# Patient Record
Sex: Female | Born: 1955
Health system: Southern US, Community
[De-identification: ages and names within clinical notes are randomized; demographics above are authoritative.]

## PROBLEM LIST (undated history)

## (undated) DIAGNOSIS — E785 Hyperlipidemia, unspecified: Secondary | ICD-10-CM

## (undated) DIAGNOSIS — K219 Gastro-esophageal reflux disease without esophagitis: Secondary | ICD-10-CM

## (undated) HISTORY — DX: Hyperlipidemia, unspecified: E78.5

## (undated) HISTORY — DX: Gastro-esophageal reflux disease without esophagitis: K21.9

## (undated) HISTORY — PX: PARTIAL HYSTERECTOMY: SHX80

---

## 2006-09-25 ENCOUNTER — Ambulatory Visit: Payer: Self-pay | Admitting: Family Medicine

## 2006-09-25 LAB — CONVERTED CEMR LAB
ALT: 24 units/L (ref 0–40)
Basophils Relative: 0.6 % (ref 0.0–1.0)
Calcium: 9.7 mg/dL (ref 8.4–10.5)
Chloride: 106 meq/L (ref 96–112)
Chol/HDL Ratio, serum: 3.3
Cholesterol: 269 mg/dL (ref 0–200)
Eosinophil percent: 1.8 % (ref 0.0–5.0)
Glucose, Bld: 82 mg/dL (ref 70–99)
HCT: 41 % (ref 36.0–46.0)
Lymphocytes Relative: 25.5 % (ref 12.0–46.0)
MCV: 89.2 fL (ref 78.0–100.0)
Monocytes Absolute: 0.3 10*3/uL (ref 0.2–0.7)
Neutrophils Relative %: 67.6 % (ref 43.0–77.0)
Platelets: 175 10*3/uL (ref 150–400)
TSH: 1.54 microintl units/mL (ref 0.35–5.50)
WBC: 6.8 10*3/uL (ref 4.5–10.5)

## 2006-10-02 ENCOUNTER — Ambulatory Visit: Payer: Self-pay | Admitting: Family Medicine

## 2014-10-06 ENCOUNTER — Encounter (INDEPENDENT_AMBULATORY_CARE_PROVIDER_SITE_OTHER): Payer: Self-pay | Admitting: *Deleted

## 2014-11-17 ENCOUNTER — Ambulatory Visit (INDEPENDENT_AMBULATORY_CARE_PROVIDER_SITE_OTHER): Payer: Self-pay | Admitting: Internal Medicine

## 2016-02-27 DIAGNOSIS — K589 Irritable bowel syndrome without diarrhea: Secondary | ICD-10-CM | POA: Insufficient documentation

## 2016-02-27 DIAGNOSIS — E7800 Pure hypercholesterolemia, unspecified: Secondary | ICD-10-CM | POA: Insufficient documentation

## 2017-01-15 DIAGNOSIS — Z1231 Encounter for screening mammogram for malignant neoplasm of breast: Secondary | ICD-10-CM | POA: Diagnosis not present

## 2017-01-15 DIAGNOSIS — Z1272 Encounter for screening for malignant neoplasm of vagina: Secondary | ICD-10-CM | POA: Diagnosis not present

## 2017-01-15 DIAGNOSIS — Z6825 Body mass index (BMI) 25.0-25.9, adult: Secondary | ICD-10-CM | POA: Diagnosis not present

## 2017-01-15 DIAGNOSIS — Z90710 Acquired absence of both cervix and uterus: Secondary | ICD-10-CM | POA: Diagnosis not present

## 2017-01-15 DIAGNOSIS — Z01419 Encounter for gynecological examination (general) (routine) without abnormal findings: Secondary | ICD-10-CM | POA: Diagnosis not present

## 2017-01-17 DIAGNOSIS — K219 Gastro-esophageal reflux disease without esophagitis: Secondary | ICD-10-CM | POA: Diagnosis not present

## 2017-01-17 DIAGNOSIS — J029 Acute pharyngitis, unspecified: Secondary | ICD-10-CM | POA: Diagnosis not present

## 2017-02-06 DIAGNOSIS — R070 Pain in throat: Secondary | ICD-10-CM | POA: Diagnosis not present

## 2017-02-06 DIAGNOSIS — Z0000 Encounter for general adult medical examination without abnormal findings: Secondary | ICD-10-CM | POA: Diagnosis not present

## 2017-02-07 ENCOUNTER — Encounter (INDEPENDENT_AMBULATORY_CARE_PROVIDER_SITE_OTHER): Payer: Self-pay | Admitting: Internal Medicine

## 2017-02-07 ENCOUNTER — Encounter (INDEPENDENT_AMBULATORY_CARE_PROVIDER_SITE_OTHER): Payer: Self-pay

## 2017-02-25 ENCOUNTER — Ambulatory Visit (INDEPENDENT_AMBULATORY_CARE_PROVIDER_SITE_OTHER): Payer: Federal, State, Local not specified - PPO | Admitting: Internal Medicine

## 2017-02-25 ENCOUNTER — Encounter (INDEPENDENT_AMBULATORY_CARE_PROVIDER_SITE_OTHER): Payer: Self-pay | Admitting: *Deleted

## 2017-02-25 ENCOUNTER — Encounter (INDEPENDENT_AMBULATORY_CARE_PROVIDER_SITE_OTHER): Payer: Self-pay | Admitting: Internal Medicine

## 2017-02-25 VITALS — BP 116/70 | HR 64 | Temp 98.4°F | Ht <= 58 in | Wt 119.1 lb

## 2017-02-25 DIAGNOSIS — K219 Gastro-esophageal reflux disease without esophagitis: Secondary | ICD-10-CM

## 2017-02-25 DIAGNOSIS — R1319 Other dysphagia: Secondary | ICD-10-CM

## 2017-02-25 DIAGNOSIS — R1310 Dysphagia, unspecified: Secondary | ICD-10-CM | POA: Diagnosis not present

## 2017-02-25 HISTORY — DX: Gastro-esophageal reflux disease without esophagitis: K21.9

## 2017-02-25 NOTE — Progress Notes (Signed)
   Subjective:    Patient ID: Sarah Hickman, female    DOB: 1956/10/12, 61 y.o.   MRN: [id number]  HPI  Referred by Bing Matter PA for dysphagia. She says she has been having trouble with acid reflux. When her acid reflux  is bad she will medicine. She tried Prilosec which did not help.  She is taking Zantac at night. Zantac is helping.  She tried Dexilant which gave her diarrhea. She has her HOB propped up.  She says it feels like something is stuck in her throat. She say all her problems is burning in her upper esophagus.  She says with the Prilosec, did  Not help with the burning. She says there is a hurting in her esophagus.  She says foods are lodging in her esophagus.  Nuts and seeds, coconut and breads with seeds feel like they are lodging and is uncomfortable. .  She can eat meats without any problem. No problems with pills She has symptoms for about 6 months.    Review of Systems     Past Medical History:  Diagnosis Date  . GERD (gastroesophageal reflux disease) 02/25/2017    No past surgical history on file.  No Known Allergies  No current outpatient prescriptions on file prior to visit.   No current facility-administered medications on file prior to visit.    Current Outpatient Prescriptions  Medication Sig Dispense Refill  . Magnesium Citrate 100 MG TABS Take by mouth.    . ranitidine (ZANTAC) 150 MG capsule Take 150 mg by mouth 2 (two) times daily.     No current facility-administered medications for this visit.      Objective:   Physical Exam Blood pressure 116/70, pulse 64, temperature 98.4 F (36.9 C), height 4\' 10"  (1.473 m), weight 119 lb 1.6 oz (54 kg). Alert and oriented. Skin warm and dry. Oral mucosa is moist.   . Sclera anicteric, conjunctivae is pink. Thyroid not enlarged. No cervical lymphadenopathy. Lungs clear. Heart regular rate and rhythm.  Abdomen is soft. Bowel sounds are positive. No hepatomegaly. No abdominal masses felt. No tenderness.  No edema  to lower extremities.          Assessment & Plan:  Dysphagia. DG esophagram. Further recommendations to follow.

## 2017-02-25 NOTE — Patient Instructions (Signed)
DG Esphagram.  

## 2017-02-27 ENCOUNTER — Encounter (HOSPITAL_COMMUNITY): Payer: Self-pay | Admitting: Radiology

## 2017-02-27 ENCOUNTER — Ambulatory Visit (HOSPITAL_COMMUNITY)
Admission: RE | Admit: 2017-02-27 | Discharge: 2017-02-27 | Disposition: A | Discharge status: Home / Self Care | Payer: Federal, State, Local not specified - PPO | Source: Ambulatory Visit | Attending: Internal Medicine | Admitting: Internal Medicine

## 2017-02-27 DIAGNOSIS — R1319 Other dysphagia: Secondary | ICD-10-CM

## 2017-02-27 DIAGNOSIS — K224 Dyskinesia of esophagus: Secondary | ICD-10-CM | POA: Diagnosis not present

## 2017-02-27 DIAGNOSIS — R1310 Dysphagia, unspecified: Secondary | ICD-10-CM | POA: Diagnosis present

## 2017-03-07 ENCOUNTER — Encounter (INDEPENDENT_AMBULATORY_CARE_PROVIDER_SITE_OTHER): Payer: Self-pay

## 2017-07-18 ENCOUNTER — Ambulatory Visit (INDEPENDENT_AMBULATORY_CARE_PROVIDER_SITE_OTHER): Payer: Federal, State, Local not specified - PPO | Admitting: Otolaryngology

## 2017-07-18 DIAGNOSIS — R070 Pain in throat: Secondary | ICD-10-CM

## 2017-07-18 DIAGNOSIS — K219 Gastro-esophageal reflux disease without esophagitis: Secondary | ICD-10-CM | POA: Diagnosis not present

## 2017-10-23 DIAGNOSIS — N644 Mastodynia: Secondary | ICD-10-CM | POA: Diagnosis not present

## 2017-10-30 DIAGNOSIS — N644 Mastodynia: Secondary | ICD-10-CM | POA: Diagnosis not present

## 2017-11-29 DIAGNOSIS — N644 Mastodynia: Secondary | ICD-10-CM | POA: Diagnosis not present

## 2017-11-29 DIAGNOSIS — R202 Paresthesia of skin: Secondary | ICD-10-CM | POA: Diagnosis not present

## 2018-05-21 DIAGNOSIS — Z0389 Encounter for observation for other suspected diseases and conditions ruled out: Secondary | ICD-10-CM | POA: Diagnosis not present

## 2018-05-21 DIAGNOSIS — N951 Menopausal and female climacteric states: Secondary | ICD-10-CM | POA: Diagnosis not present

## 2018-05-21 DIAGNOSIS — E785 Hyperlipidemia, unspecified: Secondary | ICD-10-CM | POA: Diagnosis not present

## 2018-05-21 DIAGNOSIS — R5383 Other fatigue: Secondary | ICD-10-CM | POA: Diagnosis not present

## 2018-05-21 DIAGNOSIS — R7302 Impaired glucose tolerance (oral): Secondary | ICD-10-CM | POA: Diagnosis not present

## 2018-05-21 DIAGNOSIS — Z0000 Encounter for general adult medical examination without abnormal findings: Secondary | ICD-10-CM | POA: Diagnosis not present

## 2018-05-21 DIAGNOSIS — R635 Abnormal weight gain: Secondary | ICD-10-CM | POA: Diagnosis not present

## 2018-07-08 DIAGNOSIS — R635 Abnormal weight gain: Secondary | ICD-10-CM | POA: Diagnosis not present

## 2018-07-08 DIAGNOSIS — E785 Hyperlipidemia, unspecified: Secondary | ICD-10-CM | POA: Diagnosis not present

## 2018-07-08 DIAGNOSIS — N951 Menopausal and female climacteric states: Secondary | ICD-10-CM | POA: Diagnosis not present

## 2018-07-08 DIAGNOSIS — R5383 Other fatigue: Secondary | ICD-10-CM | POA: Diagnosis not present

## 2018-08-14 DIAGNOSIS — R7302 Impaired glucose tolerance (oral): Secondary | ICD-10-CM | POA: Diagnosis not present

## 2018-08-14 DIAGNOSIS — R1013 Epigastric pain: Secondary | ICD-10-CM | POA: Diagnosis not present

## 2018-08-14 DIAGNOSIS — E785 Hyperlipidemia, unspecified: Secondary | ICD-10-CM | POA: Diagnosis not present

## 2018-11-10 DIAGNOSIS — E785 Hyperlipidemia, unspecified: Secondary | ICD-10-CM | POA: Diagnosis not present

## 2018-11-12 DIAGNOSIS — R1013 Epigastric pain: Secondary | ICD-10-CM | POA: Diagnosis not present

## 2018-11-12 DIAGNOSIS — E785 Hyperlipidemia, unspecified: Secondary | ICD-10-CM | POA: Diagnosis not present

## 2018-11-12 DIAGNOSIS — R196 Halitosis: Secondary | ICD-10-CM | POA: Diagnosis not present

## 2019-01-09 ENCOUNTER — Encounter (INDEPENDENT_AMBULATORY_CARE_PROVIDER_SITE_OTHER): Payer: Self-pay | Admitting: Nurse Practitioner

## 2019-09-16 ENCOUNTER — Other Ambulatory Visit (INDEPENDENT_AMBULATORY_CARE_PROVIDER_SITE_OTHER): Payer: Federal, State, Local not specified - PPO

## 2019-09-21 ENCOUNTER — Other Ambulatory Visit (INDEPENDENT_AMBULATORY_CARE_PROVIDER_SITE_OTHER): Payer: Medicare Other

## 2019-09-21 ENCOUNTER — Other Ambulatory Visit: Payer: Self-pay

## 2019-09-21 ENCOUNTER — Other Ambulatory Visit (INDEPENDENT_AMBULATORY_CARE_PROVIDER_SITE_OTHER): Payer: Self-pay | Admitting: Internal Medicine

## 2019-09-21 DIAGNOSIS — K219 Gastro-esophageal reflux disease without esophagitis: Secondary | ICD-10-CM

## 2019-09-21 DIAGNOSIS — E559 Vitamin D deficiency, unspecified: Secondary | ICD-10-CM

## 2019-09-21 DIAGNOSIS — Z0001 Encounter for general adult medical examination with abnormal findings: Secondary | ICD-10-CM

## 2019-09-21 DIAGNOSIS — R5383 Other fatigue: Secondary | ICD-10-CM

## 2019-09-21 DIAGNOSIS — R5381 Other malaise: Secondary | ICD-10-CM

## 2019-09-22 LAB — COMPLETE METABOLIC PANEL WITH GFR
AG Ratio: 1.6 (calc) (ref 1.0–2.5)
ALT: 19 U/L (ref 6–29)
AST: 21 U/L (ref 10–35)
Albumin: 4.7 g/dL (ref 3.6–5.1)
Alkaline phosphatase (APISO): 73 U/L (ref 37–153)
BUN: 13 mg/dL (ref 7–25)
CO2: 25 mmol/L (ref 20–32)
Calcium: 9.9 mg/dL (ref 8.6–10.4)
Chloride: 102 mmol/L (ref 98–110)
Creat: 0.63 mg/dL (ref 0.50–0.99)
GFR, Est African American: 109 mL/min/{1.73_m2} (ref >=60)
GFR, Est Non African American: 94 mL/min/{1.73_m2} (ref >=60)
Globulin: 2.9 g/dL (calc) (ref 1.9–3.7)
Glucose, Bld: 93 mg/dL (ref 65–99)
Potassium: 4.1 mmol/L (ref 3.5–5.3)
Sodium: 140 mmol/L (ref 135–146)
Total Bilirubin: 1 mg/dL (ref 0.2–1.2)
Total Protein: 7.6 g/dL (ref 6.1–8.1)

## 2019-09-22 LAB — CBC
HCT: 43.5 % (ref 35.0–45.0)
Hemoglobin: 14.5 g/dL (ref 11.7–15.5)
MCH: 30 pg (ref 27.0–33.0)
MCHC: 33.3 g/dL (ref 32.0–36.0)
MCV: 89.9 fL (ref 80.0–100.0)
MPV: 13.4 fL — ABNORMAL HIGH (ref 7.5–12.5)
Platelets: 175 10*3/uL (ref 140–400)
RBC: 4.84 10*6/uL (ref 3.80–5.10)
RDW: 12.6 % (ref 11.0–15.0)
WBC: 7.3 10*3/uL (ref 3.8–10.8)

## 2019-09-22 LAB — VITAMIN D 25 HYDROXY (VIT D DEFICIENCY, FRACTURES): Vit D, 25-Hydroxy: 39 ng/mL (ref 30–100)

## 2019-09-22 LAB — TSH: TSH: 2.22 mIU/L (ref 0.40–4.50)

## 2019-09-22 LAB — T4: T4, Total: 9.8 ug/dL (ref 5.1–11.9)

## 2019-09-22 LAB — T3, FREE: T3, Free: 3.2 pg/mL (ref 2.3–4.2)

## 2019-09-29 ENCOUNTER — Encounter (INDEPENDENT_AMBULATORY_CARE_PROVIDER_SITE_OTHER): Payer: Federal, State, Local not specified - PPO | Admitting: Nurse Practitioner

## 2019-10-15 ENCOUNTER — Ambulatory Visit (INDEPENDENT_AMBULATORY_CARE_PROVIDER_SITE_OTHER): Payer: Medicare Other | Admitting: Nurse Practitioner

## 2019-10-15 ENCOUNTER — Other Ambulatory Visit: Payer: Self-pay

## 2019-10-15 ENCOUNTER — Encounter (INDEPENDENT_AMBULATORY_CARE_PROVIDER_SITE_OTHER): Payer: Self-pay | Admitting: Nurse Practitioner

## 2019-10-15 VITALS — BP 122/72 | HR 77 | Temp 97.6°F | Resp 12 | Ht <= 58 in | Wt 131.8 lb

## 2019-10-15 DIAGNOSIS — Z0001 Encounter for general adult medical examination with abnormal findings: Secondary | ICD-10-CM | POA: Diagnosis not present

## 2019-10-15 DIAGNOSIS — E785 Hyperlipidemia, unspecified: Secondary | ICD-10-CM | POA: Diagnosis not present

## 2019-10-15 DIAGNOSIS — K219 Gastro-esophageal reflux disease without esophagitis: Secondary | ICD-10-CM | POA: Diagnosis not present

## 2019-10-15 DIAGNOSIS — Z0000 Encounter for general adult medical examination without abnormal findings: Secondary | ICD-10-CM | POA: Insufficient documentation

## 2019-10-15 DIAGNOSIS — L539 Erythematous condition, unspecified: Secondary | ICD-10-CM | POA: Diagnosis not present

## 2019-10-15 DIAGNOSIS — Z1159 Encounter for screening for other viral diseases: Secondary | ICD-10-CM | POA: Diagnosis not present

## 2019-10-15 NOTE — Assessment & Plan Note (Signed)
I recommended that she discuss this in further detail with her gastroenterologist when she sees them.  She would like to have endoscopy done for further evaluation of her symptoms.  I encouraged her to discuss this with them in addition to discussing colon cancer screening.  She tells me that she will.

## 2019-10-15 NOTE — Patient Instructions (Signed)
Thank you for choosing [facility] as your medical provider! If you have any questions or concerns regarding your health care, please do not hesitate to call our office.  Immunization: He would qualify for flu, pneumonia, and shingles vaccines.  If you would like these vaccinations please call this office.  Health maintenance: Please follow-up with the gastroenterologist as well as your OB/GYN for routine health maintenance.  Specifically for colon cancer screening as well as breast cancer screening and osteoporosis screening.  Remember to discuss your breast symptoms with your OB/GYN that we discussed in your office visit.  Blood work: Once I get your blood work results I will notify you of these and we will determine if changes to your treatment plan need to be made.  Please follow-up as scheduled in 6 months. We look forward to seeing you again soon! Have a great new year!!  At [facility] we value your feedback. You may receive a survey about your visit today. Please share your experience as we strive to create trusting relationships with our patients to provide genuine, compassionate, quality care.  We appreciate your understanding and patience as we review any laboratory studies, imaging, and other diagnostic tests that are ordered as we care for you. We do our best to address any and all results in a timely manner. If you do not hear about test results within 1 week, please do not hesitate to contact us. If we referred you to a specialist during your visit or ordered imaging testing, contact the office if you have not been contacted to be scheduled within 1 weeks.  We also encourage the use of MyChart, which contains your medical information for your review as well. If you are not enrolled in this feature, an access code is on this after visit summary for your convenience. Thank you for allowing [place] to be involved in your care.

## 2019-10-15 NOTE — Assessment & Plan Note (Signed)
I encouraged her to get immunizations especially to the flu and pneumonia vaccine.  She has declined to have these done today.  I also discussed that if she were to have any kind of penetrating wound she would need a tetanus shot.  She tells me she understands.  As for screenings I encouraged her to follow-up with her OB/GYN for Pap smear, breast cancer screening, and osteoporosis screening.  If her OB/GYN cannot do osteoporosis screening I would recommend she let us know and I can order a DEXA scan for her.  She declined to have sexual transmitted infection screening today we will also screen for hepatitis C.  Depression screen was negative.  She seems to be a low fall risk at this time.  She will follow-up for annual physical exam in 1 year.

## 2019-10-15 NOTE — Progress Notes (Addendum)
Subjective:  Patient ID: Sarah Hickman, female    DOB: 1956-09-01  Age: 63 y.o. MRN: [id number]  CC:  Chief Complaint  Patient presents with  . Annual Exam      HPI  Patient presents today for her annual physical exam.  Immunizations: She would be due for flu, pneumonia, shingles vaccines.  She tells me she would like to hold off on all vaccinations at this time.  Health maintenance screenings: She tells me she is scheduled to see her OB/GYN next month at which point she will have a Pap smear, mammogram, and will consider discussing bone density screening with them as she has completed this with them in the past.  She has me she is also scheduled to see her gastroenterologist within the next couple months as well for colon cancer screening as well as to discuss a sensation of throat swelling that has been an ongoing issue for some time.  She does have an extensive history of GERD.  She declined to have sexual transmitted infection screening done today.  She is due for depression screening.  She denies any falls in the last year.  She would be willing to undergo hepatitis C screening today.  She does not qualify for lung cancer screening nor does she need to discuss tobacco cessation.  She also complains today of a redness that comes and goes intermittently around her left nipple.  She also tells me she sometimes feels a sensation of let down, but she is postmenopausal and is not currently lactating.  She denies any nipple discharge or notices any masses to either breast.  She tells me she had similar feelings of letdown in the past when she was drinking a herbal tea, when she stopped drinking the herbal tea, the feeling passed.  She also tells me that she was evaluated by her OB/GYN in the past when she had this sensation.  She tells me recently she started drinking a different herbal tea and when she looked at the ingredients had similar ingredients to the one that she took in the past.   She has since stopped drinking herbal tea.  The sensation of letdown has now passed, but she still intermittently has the redness to the nipple area.  She denies any rash or appearance of dry skin or other skin changes to that area.  She also mentions a sensation of swelling in her throat, that has been bothersome for some time now.  She has extensive history of GERD and used to be on ranitidine but stopped this since it was recalled.  She is not on any other prescription medication for her GERD at this time.  She is planning on discussing this with her gastroenterologist when she sees them later on next month.  Hyperlipidemia: She has a history of hyperlipidemia.  Last lipid panel was collected in January 2020.  Total cholesterol that time was 289, HDL was 99, triglycerides was 98, and LDL was 168.  I believe at that time she was taking omega-3 supplementation.  It does not appear that she is taking this currently.  For some reason a repeat lipid panel was ordered last month when her blood work was collected, but it never resulted.  Past Medical History:  Diagnosis Date  . GERD (gastroesophageal reflux disease) 02/25/2017  . Hyperlipidemia       Family History  Problem Relation Age of Onset  . Rheum arthritis Mother   . Heart attack Mother   .  Diabetes Father   . Hypertension Brother   . Heart attack Paternal Grandfather     Social History   Social History Narrative  . Not on file   Social History   Tobacco Use  . Smoking status: Never Smoker  . Smokeless tobacco: Never Used  Substance Use Topics  . Alcohol use: Yes    Alcohol/week: 2.0 standard drinks    Types: 1 Glasses of wine, 1 Cans of beer per week    Comment: occasional (3-4 nights per week/wine)     Current Meds  Medication Sig  . Ascorbic Acid (VITAMIN C) 1000 MG tablet Take 1,000 mg by mouth daily.  . Betaine HCl 300 MG TABS Take by mouth.  . Cholecalciferol 125 MCG (5000 UT) TABS Take 1 tablet by mouth daily.    Marland Kitchen DIGESTIVE ENZYMES PO Take by mouth.  . Magnesium Citrate 100 MG TABS Take 3 tablets by mouth daily.  . Melatonin 10 MG TABS Take 1 tablet by mouth at bedtime as needed.  . [DISCONTINUED] Magnesium Citrate 100 MG TABS Take by mouth.    ROS:  Review of Systems  Constitutional: Negative.   HENT: Positive for ear pain (intermittent, not bothersome today).   Eyes: Negative.   Respiratory: Negative.   Cardiovascular: Negative.   Gastrointestinal: Positive for diarrhea and heartburn. Negative for abdominal pain and blood in stool.  Genitourinary: Negative.   Musculoskeletal: Negative.   Skin:       (+) Redness to left areola, intermittently  Neurological: Negative.   Endo/Heme/Allergies: Negative.   Psychiatric/Behavioral: Negative.      Objective:   Today's Vitals: BP 122/72   Pulse 77   Temp 97.6 F (36.4 C)   Resp 12   Ht 4\' 10"  (1.473 m)   Wt 131 lb 12.8 oz (59.8 kg)   SpO2 97%   BMI 27.55 kg/m  Vitals with BMI 10/15/2019 02/25/2017  Height 4\' 10"  4\' 10"   Weight 131 lbs 13 oz 119 lbs 2 oz  BMI [id number]  Systolic [id number] [id number]  Diastolic 72 70  Pulse 77 64     Physical Exam Vitals reviewed.  Constitutional:      Appearance: Normal appearance.  HENT:     Head: Normocephalic and atraumatic.     Right Ear: Ear canal and external ear normal. There is impacted cerumen.     Left Ear: Ear canal and external ear normal. There is impacted cerumen.  Eyes:     General:        Right eye: No discharge.        Left eye: No discharge.     Extraocular Movements: Extraocular movements intact.     Conjunctiva/sclera: Conjunctivae normal.     Pupils: Pupils are equal, round, and reactive to light.  Neck:     Vascular: No carotid bruit.  Cardiovascular:     Rate and Rhythm: Normal rate and regular rhythm.     Pulses: Normal pulses.     Heart sounds: Normal heart sounds. No murmur.  Pulmonary:     Effort: Pulmonary effort is normal.     Breath sounds: Normal breath sounds.   Chest:     Breasts: Breasts are symmetrical.        Right: Normal.        Left: Inverted nipple present.  Abdominal:     General: Abdomen is flat. Bowel sounds are normal. There is no distension.     Palpations: Abdomen is soft. There is no mass.  Tenderness: There is no abdominal tenderness.  Musculoskeletal:        General: No tenderness.     Cervical back: Neck supple. No muscular tenderness.     Right lower leg: No edema.     Left lower leg: No edema.  Lymphadenopathy:     Cervical: No cervical adenopathy.     Upper Body:     Right upper body: No supraclavicular adenopathy.     Left upper body: No supraclavicular adenopathy.  Skin:    General: Skin is warm and dry.  Neurological:     General: No focal deficit present.     Mental Status: She is alert and oriented to person, place, and time.     Motor: No weakness.     Gait: Gait normal.  Psychiatric:        Mood and Affect: Mood normal.        Behavior: Behavior normal.        Judgment: Judgment normal.     PHQ 2: Negative     Assessment   1. Medicare annual wellness visit, subsequent   2. Encounter for hepatitis C screening test for low risk patient   3. Hyperlipidemia, unspecified hyperlipidemia type   4. Gastroesophageal reflux disease without esophagitis   5. Redness   6. Annual physical exam       Tests ordered Orders Placed This Encounter  Procedures  . Hep C Antibody  . Lipid Panel     Plan: Please see assessment and plan per problem list below.   No orders of the defined types were placed in this encounter.   Patient to follow-up in 6 months or sooner as needed.  She will also be due for annual physical exam again in 1 year.  In addition to performing her annual physical exam I also performed an office visit to address her history of hyperlipidemia as well as acute concerns that were discussed above.  Ailene Ards, NP

## 2019-10-15 NOTE — Assessment & Plan Note (Signed)
I will collect lipid panel today for further evaluation.  Last blood panel was collected in January 2020 and she did have hyperlipidemia.  She was curious to see what her lipid panel is currently, and for some reason this was ordered when her previous blood work was taken but never resulted.  I will reorder this today for further evaluation.

## 2019-10-15 NOTE — Assessment & Plan Note (Signed)
Breast exam was normal today except she did have some mild inversion to her left nipple.  She tells me that this is not a new finding.  No redness, rash, nipple discharge noted.  No masses noted either.  I recommended that she discuss this with her OB/GYN so that when she goes for breast cancer screening that they can do a diagnostic mammogram as opposed to a screening mammogram.  She tells me she understands, and plans on discussing this with them.

## 2019-10-19 LAB — LIPID PANEL
Cholesterol: 293 mg/dL — ABNORMAL HIGH (ref <200)
HDL: 95 mg/dL (ref >=50)
LDL Cholesterol (Calc): 174 mg/dL (calc) — ABNORMAL HIGH
Non-HDL Cholesterol (Calc): 198 mg/dL (calc) — ABNORMAL HIGH (ref <130)
Total CHOL/HDL Ratio: 3.1 (calc) (ref <5.0)
Triglycerides: 111 mg/dL (ref <150)

## 2019-10-19 LAB — HEPATITIS C ANTIBODY
Hepatitis C Ab: NONREACTIVE
SIGNAL TO CUT-OFF: 0.01 (ref <1.00)

## 2019-10-26 ENCOUNTER — Encounter (INDEPENDENT_AMBULATORY_CARE_PROVIDER_SITE_OTHER): Payer: Self-pay | Admitting: Nurse Practitioner

## 2019-10-26 NOTE — Progress Notes (Signed)
I did call this patient today to discuss her lab work.  We discussed trying medication to control her cholesterol, but she would like to focus on lifestyle changes for now as opposed to starting medication to control her cholesterol.  I encouraged to continue doing this.  She is encouraged to call this office any questions or concerns prior to her next appointment.

## 2019-10-28 ENCOUNTER — Other Ambulatory Visit: Payer: Self-pay

## 2019-10-28 ENCOUNTER — Encounter (INDEPENDENT_AMBULATORY_CARE_PROVIDER_SITE_OTHER): Payer: Self-pay | Admitting: *Deleted

## 2019-10-28 ENCOUNTER — Ambulatory Visit (INDEPENDENT_AMBULATORY_CARE_PROVIDER_SITE_OTHER): Payer: Medicare Other | Admitting: Gastroenterology

## 2019-10-28 ENCOUNTER — Encounter (INDEPENDENT_AMBULATORY_CARE_PROVIDER_SITE_OTHER): Payer: Self-pay | Admitting: Gastroenterology

## 2019-10-28 ENCOUNTER — Telehealth (INDEPENDENT_AMBULATORY_CARE_PROVIDER_SITE_OTHER): Payer: Self-pay | Admitting: *Deleted

## 2019-10-28 VITALS — BP 123/78 | HR 102 | Temp 97.1°F | Ht <= 58 in | Wt 129.8 lb

## 2019-10-28 DIAGNOSIS — K219 Gastro-esophageal reflux disease without esophagitis: Secondary | ICD-10-CM | POA: Insufficient documentation

## 2019-10-28 DIAGNOSIS — J029 Acute pharyngitis, unspecified: Secondary | ICD-10-CM | POA: Diagnosis not present

## 2019-10-28 DIAGNOSIS — Z1211 Encounter for screening for malignant neoplasm of colon: Secondary | ICD-10-CM | POA: Diagnosis not present

## 2019-10-28 NOTE — Patient Instructions (Signed)
Try 2-3 week course of allergy medication medication such as zyrtec or claritin in case post nasal drip contributing to symptoms.    We are scheduling endoscopy and colonoscopy.

## 2019-10-28 NOTE — Telephone Encounter (Signed)
Patient needs suprep TCS/EGD sch'd 2/18

## 2019-10-28 NOTE — Progress Notes (Signed)
Patient profile: Sarah Hickman is a 64 y.o. female seen for evaluation of colon cancer screening and globus/sore throat/gerd. Last seen in clinic 2018  History of Present Illness: Sarah Hickman is seen today for a symptom of having  "throat burning" or feeling that something is stuck in her throat/coating her throat, this has been intermittent over many years.  Does not feel the food stick when she is swallowing or that she has to regurgitate food.  Feels that something is coating her esophagus at times.  Most notable after bread.  Other times eating will make it better.  It is associated with a sore throat.  She reports she has been seen by ENT with laryngoscopic suggesting GERD and tried 2 types of PPIs as well as Zantac without improvement of sensation.  She does not have any cough.  She remains upright after meals.  She denies nausea vomiting.  She has classic GERD burning symptoms only after food such as pizza or beer, this feels different than the symptoms she is getting more frequently of the "coating of her esophagus".  Bowels are regular twice a day, she does use a digestive enzyme which makes stools looser.  She denies any blood in stool or black stool.  Denies NSAID use.  Rare alcohol.  Non-smoker.  Wt Readings from Last 3 Encounters:  10/28/19 129 lb 12.8 oz (58.9 kg)  10/15/19 131 lb 12.8 oz (59.8 kg)  02/25/17 119 lb 1.6 oz (54 kg)     Last Colonoscopy: May 2010 - normal Last Endoscopy: none prior   Barium swallow 02/2017-IMPRESSION: Minimal age-related esophageal dysmotility. Otherwise negative exam.   Past Medical History:  Past Medical History:  Diagnosis Date  . GERD (gastroesophageal reflux disease) 02/25/2017  . Hyperlipidemia     Problem List: Patient Active Problem List   Diagnosis Date Noted  . Colon cancer screening 10/28/2019  . Gastroesophageal reflux disease 10/28/2019  . Sore throat 10/28/2019  . Redness 10/15/2019  . Annual physical exam 10/15/2019    . Hyperlipidemia 10/15/2019  . GERD (gastroesophageal reflux disease) 02/25/2017    Past Surgical History: Past Surgical History:  Procedure Laterality Date  . PARTIAL HYSTERECTOMY     one ovary is left (rt). endometriosis    Allergies: No Known Allergies    Home Medications:  Current Outpatient Medications:  .  Ascorbic Acid (VITAMIN C) 1000 MG tablet, Take 1,000 mg by mouth daily., Disp: , Rfl:  .  Betaine HCl 300 MG TABS, Take 300 mg by mouth as needed. , Disp: , Rfl:  .  Cholecalciferol 125 MCG (5000 UT) TABS, Take 1 tablet by mouth daily., Disp: , Rfl:  .  DIGESTIVE ENZYMES PO, Take by mouth daily. , Disp: , Rfl:  .  Magnesium Citrate 100 MG TABS, Take 3 tablets by mouth daily., Disp: , Rfl:  .  Melatonin 10 MG TABS, Take 1 tablet by mouth at bedtime as needed., Disp: , Rfl:    Family History: family history includes Diabetes in her father; Heart attack in her mother and paternal grandfather; Hypertension in her brother; Rheum arthritis in her mother.    Social History:   reports that she has never smoked. She has never used smokeless tobacco. She reports current alcohol use of about 2.0 standard drinks of alcohol per week. She reports that she does not use drugs.   Review of Systems: Constitutional: Denies weight loss/weight gain  Eyes: No changes in vision. ENT: No oral lesions, sore throat.  GI:  see HPI.  Heme/Lymph: No easy bruising.  CV: No chest pain.  GU: No hematuria.  Integumentary: No rashes.  Neuro: No headaches.  Psych: No depression/anxiety.  Endocrine: No heat/cold intolerance.  Allergic/Immunologic: No urticaria.  Resp: No cough, SOB.  Musculoskeletal: No joint swelling.    Physical Examination: BP 123/78 (BP Location: Right Arm, Patient Position: Sitting, Cuff Size: Large)   Pulse (!) 102   Temp (!) 97.1 F (36.2 C) (Temporal)   Ht 4\' 10"  (1.473 m)   Wt 129 lb 12.8 oz (58.9 kg)   BMI 27.13 kg/m  Gen: NAD, alert and oriented x 4 HEENT:  PEERLA, EOMI, Neck: supple, no JVD Chest: CTA bilaterally, no wheezes, crackles, or other adventitious sounds CV: RRR, no m/g/c/r Abd: soft, NT, ND, +BS in all four quadrants; no HSM, guarding, ridigity, or rebound tenderness Ext: no edema, well perfused with 2+ pulses, Skin: no rash or lesions noted on observed skin Lymph: no noted LAD  Data Reviewed:   November 2020-CMP normal, CBC normal  Assessment/Plan: Ms. Woodhull is a 65 y.o. female    Sarah Hickman was seen today for follow-up.  Diagnoses and all orders for this visit:  Colon cancer screening -     Procedural/ Surgical Case Request: COLONOSCOPY, ESOPHAGOGASTRODUODENOSCOPY (EGD); Standing -     Procedural/ Surgical Case Request: COLONOSCOPY, ESOPHAGOGASTRODUODENOSCOPY (EGD)  Gastroesophageal reflux disease, unspecified whether esophagitis present -     Procedural/ Surgical Case Request: COLONOSCOPY, ESOPHAGOGASTRODUODENOSCOPY (EGD); Standing -     Procedural/ Surgical Case Request: COLONOSCOPY, ESOPHAGOGASTRODUODENOSCOPY (EGD)  Sore throat -     Procedural/ Surgical Case Request: COLONOSCOPY, ESOPHAGOGASTRODUODENOSCOPY (EGD); Standing -     Procedural/ Surgical Case Request: COLONOSCOPY, ESOPHAGOGASTRODUODENOSCOPY (EGD)    1.  Colon cancer screening-last colonoscopy 2010, overdue for routine surveillance.  No lower GI symptoms or family history of colon polyps/colon cancer.  2.  GERD/globus/sore throat-has been diagnosed by ENT with GERD but trials of PPI did not help sore throat and feeling of abnormal "coating" of esophagus/globus.  Has classic GERD symptoms after known trigger foods left often.  She has never had endoscopy and will schedule this at time of colonoscopy.  Also suggested trial of allergy medicine as postnasal drip could potentially be contributing.  Patient denies CP, SOB, and use of blood thinners. I discussed the risks and benefits of procedure including bleeding, perforation, infection, missed lesions,  medication reactions and possible hospitalization or surgery if complications. All questions answered.  Denies prior issues with sedation.     I personally performed the service, non-incident to. (WP)  Laurine Blazer, [facility]

## 2019-10-30 MED ORDER — SUPREP BOWEL PREP KIT 17.5-3.13-1.6 GM/177ML PO SOLN: 1.0000 | Freq: Once | ORAL | 0 refills | Status: AC

## 2019-10-30 NOTE — Telephone Encounter (Signed)
Refill sent to pharmacy.   

## 2019-11-02 ENCOUNTER — Ambulatory Visit (INDEPENDENT_AMBULATORY_CARE_PROVIDER_SITE_OTHER): Payer: Medicare Other | Admitting: Gastroenterology

## 2019-11-06 ENCOUNTER — Other Ambulatory Visit (INDEPENDENT_AMBULATORY_CARE_PROVIDER_SITE_OTHER): Payer: Self-pay | Admitting: *Deleted

## 2019-11-10 ENCOUNTER — Other Ambulatory Visit (INDEPENDENT_AMBULATORY_CARE_PROVIDER_SITE_OTHER): Payer: Self-pay | Admitting: *Deleted

## 2019-11-11 ENCOUNTER — Other Ambulatory Visit (INDEPENDENT_AMBULATORY_CARE_PROVIDER_SITE_OTHER): Payer: Self-pay | Admitting: *Deleted

## 2019-11-18 ENCOUNTER — Other Ambulatory Visit (INDEPENDENT_AMBULATORY_CARE_PROVIDER_SITE_OTHER): Payer: Self-pay | Admitting: *Deleted

## 2019-12-08 ENCOUNTER — Other Ambulatory Visit: Payer: Self-pay

## 2019-12-08 ENCOUNTER — Other Ambulatory Visit (HOSPITAL_COMMUNITY)
Admission: RE | Admit: 2019-12-08 | Discharge: 2019-12-08 | Disposition: A | Discharge status: Home / Self Care | Payer: Medicare Other | Source: Ambulatory Visit | Attending: Internal Medicine | Admitting: Internal Medicine

## 2019-12-08 ENCOUNTER — Other Ambulatory Visit (HOSPITAL_COMMUNITY): Payer: Medicare Other

## 2019-12-08 DIAGNOSIS — Z20822 Contact with and (suspected) exposure to covid-19: Secondary | ICD-10-CM | POA: Insufficient documentation

## 2019-12-08 DIAGNOSIS — Z01812 Encounter for preprocedural laboratory examination: Secondary | ICD-10-CM | POA: Diagnosis present

## 2019-12-08 LAB — SARS CORONAVIRUS 2 (TAT 6-24 HRS): SARS Coronavirus 2: NEGATIVE

## 2019-12-09 ENCOUNTER — Other Ambulatory Visit (INDEPENDENT_AMBULATORY_CARE_PROVIDER_SITE_OTHER): Payer: Self-pay | Admitting: *Deleted

## 2020-01-01 ENCOUNTER — Other Ambulatory Visit: Payer: Self-pay

## 2020-01-01 ENCOUNTER — Other Ambulatory Visit (HOSPITAL_COMMUNITY)
Admission: RE | Admit: 2020-01-01 | Discharge: 2020-01-01 | Disposition: A | Discharge status: Home / Self Care | Payer: Medicare Other | Source: Ambulatory Visit | Attending: Internal Medicine | Admitting: Internal Medicine

## 2020-01-01 DIAGNOSIS — Z01812 Encounter for preprocedural laboratory examination: Secondary | ICD-10-CM | POA: Insufficient documentation

## 2020-01-01 DIAGNOSIS — Z20822 Contact with and (suspected) exposure to covid-19: Secondary | ICD-10-CM | POA: Diagnosis not present

## 2020-01-02 LAB — SARS CORONAVIRUS 2 (TAT 6-24 HRS): SARS Coronavirus 2: NEGATIVE

## 2020-01-04 ENCOUNTER — Encounter (HOSPITAL_COMMUNITY)
Admission: RE | Disposition: A | Discharge status: Home / Self Care | Payer: Self-pay | Source: Home / Self Care | Attending: Internal Medicine

## 2020-01-04 ENCOUNTER — Ambulatory Visit (HOSPITAL_COMMUNITY)
Admission: RE | Admit: 2020-01-04 | Discharge: 2020-01-04 | Disposition: A | Discharge status: Home / Self Care | Payer: Medicare Other | Attending: Internal Medicine | Admitting: Internal Medicine

## 2020-01-04 ENCOUNTER — Other Ambulatory Visit: Payer: Self-pay

## 2020-01-04 ENCOUNTER — Encounter (HOSPITAL_COMMUNITY): Payer: Self-pay | Admitting: Internal Medicine

## 2020-01-04 DIAGNOSIS — D120 Benign neoplasm of cecum: Secondary | ICD-10-CM | POA: Diagnosis not present

## 2020-01-04 DIAGNOSIS — Z1211 Encounter for screening for malignant neoplasm of colon: Secondary | ICD-10-CM

## 2020-01-04 DIAGNOSIS — K317 Polyp of stomach and duodenum: Secondary | ICD-10-CM | POA: Insufficient documentation

## 2020-01-04 DIAGNOSIS — K449 Diaphragmatic hernia without obstruction or gangrene: Secondary | ICD-10-CM | POA: Insufficient documentation

## 2020-01-04 DIAGNOSIS — K5730 Diverticulosis of large intestine without perforation or abscess without bleeding: Secondary | ICD-10-CM | POA: Diagnosis not present

## 2020-01-04 DIAGNOSIS — K2100 Gastro-esophageal reflux disease with esophagitis, without bleeding: Secondary | ICD-10-CM | POA: Diagnosis not present

## 2020-01-04 DIAGNOSIS — K219 Gastro-esophageal reflux disease without esophagitis: Secondary | ICD-10-CM | POA: Diagnosis not present

## 2020-01-04 DIAGNOSIS — J029 Acute pharyngitis, unspecified: Secondary | ICD-10-CM

## 2020-01-04 HISTORY — PX: COLONOSCOPY: SHX5424

## 2020-01-04 HISTORY — PX: POLYPECTOMY: SHX5525

## 2020-01-04 HISTORY — PX: BIOPSY: SHX5522

## 2020-01-04 HISTORY — PX: ESOPHAGOGASTRODUODENOSCOPY: SHX5428

## 2020-01-04 SURGERY — COLONOSCOPY
Anesthesia: Moderate Sedation

## 2020-01-04 MED ORDER — ESOMEPRAZOLE MAGNESIUM 40 MG PO CPDR: 40.0000 mg | DELAYED_RELEASE_CAPSULE | Freq: Every day | ORAL | 5 refills | Status: DC

## 2020-01-04 MED ORDER — SODIUM CHLORIDE 0.9 % IV SOLN: INTRAVENOUS | Status: DC

## 2020-01-04 MED ORDER — STERILE WATER FOR IRRIGATION IR SOLN: Status: DC | PRN

## 2020-01-04 MED ORDER — LIDOCAINE VISCOUS HCL 2 % MT SOLN
OROMUCOSAL | Status: AC
  Filled 2020-01-04: qty 15

## 2020-01-04 MED ORDER — MIDAZOLAM HCL 5 MG/5ML IJ SOLN
INTRAMUSCULAR | Status: DC | PRN
  Administered 2020-01-04: 1 mg via INTRAVENOUS
  Administered 2020-01-04 (×2): 2 mg via INTRAVENOUS
  Administered 2020-01-04: 1 mg via INTRAVENOUS

## 2020-01-04 MED ORDER — MEPERIDINE HCL 50 MG/ML IJ SOLN
INTRAMUSCULAR | Status: AC
  Filled 2020-01-04: qty 1

## 2020-01-04 MED ORDER — LIDOCAINE VISCOUS HCL 2 % MT SOLN
OROMUCOSAL | Status: DC | PRN
  Administered 2020-01-04: 1 via OROMUCOSAL

## 2020-01-04 MED ORDER — MIDAZOLAM HCL 5 MG/5ML IJ SOLN
INTRAMUSCULAR | Status: AC
  Filled 2020-01-04: qty 10

## 2020-01-04 MED ORDER — MEPERIDINE HCL 50 MG/ML IJ SOLN
INTRAMUSCULAR | Status: DC | PRN
  Administered 2020-01-04 (×2): 25 mg

## 2020-01-04 NOTE — Discharge Instructions (Signed)
 Gastroesophageal Reflux Disease, Adult Gastroesophageal reflux (GER) happens when acid from the stomach flows up into the tube that connects the mouth and the stomach (esophagus). Normally, food travels down the esophagus and stays in the stomach to be digested. With GER, food and stomach acid sometimes move back up into the esophagus. You may have a disease called gastroesophageal reflux disease (GERD) if the reflux:  Happens often.  Causes frequent or very bad symptoms.  Causes problems such as damage to the esophagus. When this happens, the esophagus becomes sore and swollen (inflamed). Over time, GERD can make small holes (ulcers) in the lining of the esophagus. What are the causes? This condition is caused by a problem with the muscle between the esophagus and the stomach. When this muscle is weak or not normal, it does not close properly to keep food and acid from coming back up from the stomach. The muscle can be weak because of:  Tobacco use.  Pregnancy.  Having a certain type of hernia (hiatal hernia).  Alcohol use.  Certain foods and drinks, such as coffee, chocolate, onions, and peppermint. What increases the risk? You are more likely to develop this condition if you:  Are overweight.  Have a disease that affects your connective tissue.  Use NSAID medicines. What are the signs or symptoms? Symptoms of this condition include:  Heartburn.  Difficult or painful swallowing.  The feeling of having a lump in the throat.  A bitter taste in the mouth.  Bad breath.  Having a lot of saliva.  Having an upset or bloated stomach.  Belching.  Chest pain. Different conditions can cause chest pain. Make sure you see your doctor if you have chest pain.  Shortness of breath or noisy breathing (wheezing).  Ongoing (chronic) cough or a cough at night.  Wearing away of the surface of teeth (tooth enamel).  Weight loss. How is this treated? Treatment will depend on  how bad your symptoms are. Your doctor may suggest:  Changes to your diet.  Medicine.  Surgery. Follow these instructions at home: Eating and drinking   Follow a diet as told by your doctor. You may need to avoid foods and drinks such as: ? Coffee and tea (with or without caffeine). ? Drinks that contain alcohol. ? Energy drinks and sports drinks. ? Bubbly (carbonated) drinks or sodas. ? Chocolate and cocoa. ? Peppermint and mint flavorings. ? Garlic and onions. ? Horseradish. ? Spicy and acidic foods. These include peppers, chili powder, curry powder, vinegar, hot sauces, and BBQ sauce. ? Citrus fruit juices and citrus fruits, such as oranges, lemons, and limes. ? Tomato-based foods. These include red sauce, chili, salsa, and pizza with red sauce. ? Fried and fatty foods. These include donuts, french fries, potato chips, and high-fat dressings. ? High-fat meats. These include hot dogs, rib eye steak, sausage, ham, and bacon. ? High-fat dairy items, such as whole milk, butter, and cream cheese.  Eat small meals often. Avoid eating large meals.  Avoid drinking large amounts of liquid with your meals.  Avoid eating meals during the 2-3 hours before bedtime.  Avoid lying down right after you eat.  Do not exercise right after you eat. Lifestyle   Do not use any products that contain nicotine or tobacco. These include cigarettes, e-cigarettes, and chewing tobacco. If you need help quitting, ask your doctor.  Try to lower your stress. If you need help doing this, ask your doctor.  If you are overweight, lose an   amount of weight that is healthy for you. Ask your doctor about a safe weight loss goal. General instructions  Pay attention to any changes in your symptoms.  Take over-the-counter and prescription medicines only as told by your doctor. Do not take aspirin, ibuprofen, or other NSAIDs unless your doctor says it is okay.  Wear loose clothes. Do not wear anything tight  around your waist.  Raise (elevate) the head of your bed about 6 inches (15 cm).  Avoid bending over if this makes your symptoms worse.  Keep all follow-up visits as told by your doctor. This is important. Contact a doctor if:  You have new symptoms.  You lose weight and you do not know why.  You have trouble swallowing or it hurts to swallow.  You have wheezing or a cough that keeps happening.  Your symptoms do not get better with treatment.  You have a hoarse voice. Get help right away if:  You have pain in your arms, neck, jaw, teeth, or back.  You feel sweaty, dizzy, or light-headed.  You have chest pain or shortness of breath.  You throw up (vomit) and your throw-up looks like blood or coffee grounds.  You pass out (faint).  Your poop (stool) is bloody or black.  You cannot swallow, drink, or eat. Summary  If a person has gastroesophageal reflux disease (GERD), food and stomach acid move back up into the esophagus and cause symptoms or problems such as damage to the esophagus.  Treatment will depend on how bad your symptoms are.  Follow a diet as told by your doctor.  Take all medicines only as told by your doctor. This information is not intended to replace advice given to you by your health care provider. Make sure you discuss any questions you have with your health care provider. Document Revised: 04/16/2018 Document Reviewed: 04/16/2018 Elsevier Patient Education  [place].  Diverticulosis  Diverticulosis is a condition that develops when small pouches (diverticula) form in the wall of the large intestine (colon). The colon is where water is absorbed and stool (feces) is formed. The pouches form when the inside layer of the colon pushes through weak spots in the outer layers of the colon. You may have a few pouches or many of them. The pouches usually do not cause problems unless they become inflamed or infected. When this happens, the condition  is called diverticulitis. What are the causes? The cause of this condition is not known. What increases the risk? The following factors may make you more likely to develop this condition:  Being older than age 4. Your risk for this condition increases with age. Diverticulosis is rare among people younger than age 30. By age 76, many people have it.  Eating a low-fiber diet.  Having frequent constipation.  Being overweight.  Not getting enough exercise.  Smoking.  Taking over-the-counter pain medicines, like aspirin and ibuprofen.  Having a family history of diverticulosis. What are the signs or symptoms? In most people, there are no symptoms of this condition. If you do have symptoms, they may include:  Bloating.  Cramps in the abdomen.  Constipation or diarrhea.  Pain in the lower left side of the abdomen. How is this diagnosed? Because diverticulosis usually has no symptoms, it is most often diagnosed during an exam for other colon problems. The condition may be diagnosed by:  Using a flexible scope to examine the colon (colonoscopy).  Taking an X-ray of the colon after dye has  been put into the colon (barium enema).  Having a CT scan. How is this treated? You may not need treatment for this condition. Your health care provider may recommend treatment to prevent problems. You may need treatment if you have symptoms or if you previously had diverticulitis. Treatment may include:  Eating a high-fiber diet.  Taking a fiber supplement.  Taking a live bacteria supplement (probiotic).  Taking medicine to relax your colon. Follow these instructions at home: Medicines  Take over-the-counter and prescription medicines only as told by your health care provider.  If told by your health care provider, take a fiber supplement or probiotic. Constipation prevention Your condition may cause constipation. To prevent or treat constipation, you may need to:  Drink enough  fluid to keep your urine pale yellow.  Take over-the-counter or prescription medicines.  Eat foods that are high in fiber, such as beans, whole grains, and fresh fruits and vegetables.  Limit foods that are high in fat and processed sugars, such as fried or sweet foods.  General instructions  Try not to strain when you have a bowel movement.  Keep all follow-up visits as told by your health care provider. This is important. Contact a health care provider if you:  Have pain in your abdomen.  Have bloating.  Have cramps.  Have not had a bowel movement in 3 days. Get help right away if:  Your pain gets worse.  Your bloating becomes very bad.  You have a fever or chills, and your symptoms suddenly get worse.  You vomit.  You have bowel movements that are bloody or black.  You have bleeding from your rectum. Summary  Diverticulosis is a condition that develops when small pouches (diverticula) form in the wall of the large intestine (colon).  You may have a few pouches or many of them.  This condition is most often diagnosed during an exam for other colon problems.  Treatment may include increasing the fiber in your diet, taking supplements, or taking medicines. This information is not intended to replace advice given to you by your health care provider. Make sure you discuss any questions you have with your health care provider. Document Revised: 05/07/2019 Document Reviewed: 05/07/2019 Elsevier Patient Education  [place]. Colonoscopy, Adult, Care After This sheet gives you information about how to care for yourself after your procedure. Your health care provider may also give you more specific instructions. If you have problems or questions, contact your health care provider. What can I expect after the procedure? After the procedure, it is common to have:  A small amount of blood in your stool for 24 hours after the procedure.  Some gas.  Mild cramping  or bloating of your abdomen. Follow these instructions at home: Eating and drinking   Drink enough fluid to keep your urine pale yellow.  Follow instructions from your health care provider about eating or drinking restrictions.  Resume your normal diet as instructed by your health care provider. Avoid heavy or fried foods that are hard to digest. Activity  Rest as told by your health care provider.  Avoid sitting for a long time without moving. Get up to take short walks every 1-2 hours. This is important to improve blood flow and breathing. Ask for help if you feel weak or unsteady.  Return to your normal activities as told by your health care provider. Ask your health care provider what activities are safe for you. Managing cramping and bloating   Try  walking around when you have cramps or feel bloated.  Apply heat to your abdomen as told by your health care provider. Use the heat source that your health care provider recommends, such as a moist heat pack or a heating pad. ? Place a towel between your skin and the heat source. ? Leave the heat on for 20-30 minutes. ? Remove the heat if your skin turns bright red. This is especially important if you are unable to feel pain, heat, or cold. You may have a greater risk of getting burned. General instructions  For the first 24 hours after the procedure: ? Do not drive or use machinery. ? Do not sign important documents. ? Do not drink alcohol. ? Do your regular daily activities at a slower pace than normal. ? Eat soft foods that are easy to digest.  Take over-the-counter and prescription medicines only as told by your health care provider.  Keep all follow-up visits as told by your health care provider. This is important. Contact a health care provider if:  You have blood in your stool 2-3 days after the procedure. Get help right away if you have:  More than a small spotting of blood in your stool.  Large blood clots in your  stool.  Swelling of your abdomen.  Nausea or vomiting.  A fever.  Increasing pain in your abdomen that is not relieved with medicine. Summary  After the procedure, it is common to have a small amount of blood in your stool. You may also have mild cramping and bloating of your abdomen.  For the first 24 hours after the procedure, do not drive or use machinery, sign important documents, or drink alcohol.  Get help right away if you have a lot of blood in your stool, nausea or vomiting, a fever, or increased pain in your abdomen. This information is not intended to replace advice given to you by your health care provider. Make sure you discuss any questions you have with your health care provider. Document Revised: 05/04/2019 Document Reviewed: 05/04/2019 Elsevier Patient Education  [place]. No aspirin or NSAIDs for 1 week. Resume other medications as before. Esomeprazole 40 mg by mouth 30 minutes before breakfast daily. Resume usual diet. Antireflux measures sheet. No driving for 24 hours. Physician will call with biopsy results.

## 2020-01-04 NOTE — Op Note (Signed)
[facility] Patient Name: Sarah Hickman Procedure Date: 01/04/2020 7:12 AM MRN: [id number] Date of Birth: [birth year] Attending MD: Hildred Laser , MD CSN: [id number] Age: 64 Admit Type: Outpatient Procedure:                Upper GI endoscopy Indications:              Suspected gastro-esophageal reflux disease Providers:                Hildred Laser, MD, Janeece Riggers, RN, Randa Spike,                            Technician Referring MD:             Doree Albee, MD Medicines:                Lidocaine spray, Meperidine 50 mg IV, Midazolam 5                            mg IV Complications:            No immediate complications. Estimated Blood Loss:     Estimated blood loss was minimal. Procedure:                Pre-Anesthesia Assessment:                           - Prior to the procedure, a History and Physical                            was performed, and patient medications and                            allergies were reviewed. The patient's tolerance of                            previous anesthesia was also reviewed. The risks                            and benefits of the procedure and the sedation                            options and risks were discussed with the patient.                            All questions were answered, and informed consent                            was obtained. Prior Anticoagulants: The patient has                            taken no previous anticoagulant or antiplatelet                            agents except for NSAID medication. ASA Grade  Assessment: I - A normal, healthy patient. After                            reviewing the risks and benefits, the patient was                            deemed in satisfactory condition to undergo the                            procedure.                           After obtaining informed consent, the endoscope was                            passed under direct vision.  Throughout the                            procedure, the patient's blood pressure, pulse, and                            oxygen saturations were monitored continuously. The                            [id number]-[id number] scope was introduced through the                            mouth, and advanced to the second part of duodenum.                            The upper GI endoscopy was accomplished without                            difficulty. The patient tolerated the procedure                            well. Scope In: 7:43:21 AM Scope Out: 7:51:26 AM Total Procedure Duration: 0 hours 8 minutes 5 seconds  Findings:      The hypopharynx was normal.      LA Grade A (one or more mucosal breaks less than 5 mm, not extending       between tops of 2 mucosal folds) esophagitis with no bleeding was found       37 cm from the incisors.      The exam of the esophagus was otherwise normal.      A 2 cm hiatal hernia was present.      A few 3 to 6 mm pedunculated and sessile polyps with no bleeding and no       stigmata of recent bleeding were found in the gastric fundus. Biopsies       were taken with a cold forceps for histology. The pathology specimen was       placed into Bottle Number 1.      The exam of the stomach was otherwise normal.      The duodenal bulb and second portion of the duodenum were normal.  Impression:               - Normal hypopharynx.                           - LA Grade A reflux esophagitis with no bleeding.                           - 2 cm hiatal hernia.                           - A few gastric polyps. Biopsied.                           - Normal duodenal bulb and second portion of the                            duodenum. Moderate Sedation:      Moderate (conscious) sedation was administered by the endoscopy nurse       and supervised by the endoscopist. The following parameters were       monitored: oxygen saturation, heart rate, blood pressure, CO2       capnography  and response to care. Total physician intraservice time was       12 minutes. Recommendation:           - Patient has a contact number available for                            emergencies. The signs and symptoms of potential                            delayed complications were discussed with the                            patient. Return to normal activities tomorrow.                            Written discharge instructions were provided to the                            patient.                           - Resume previous diet today.                           - Continue present medications.                           - Esomeprazole 40 mg po qam.                           - Await pathology results.                           - See the other procedure note for documentation of  additional recommendations. Procedure Code(s):        --- Professional ---                           [phone], Esophagogastroduodenoscopy, flexible,                            transoral; with biopsy, single or multiple                           G0500, Moderate sedation services provided by the                            same physician or other qualified health care                            professional performing a gastrointestinal                            endoscopic service that sedation supports,                            requiring the presence of an independent trained                            observer to assist in the monitoring of the                            patient's level of consciousness and physiological                            status; initial 15 minutes of intra-service time;                            patient age 86 years or older (additional time may                            be reported with [phone], as appropriate) Diagnosis Code(s):        --- Professional ---                           K21.00, Gastro-esophageal reflux disease with                             esophagitis, without bleeding                           K44.9, Diaphragmatic hernia without obstruction or                            gangrene                           K31.7, Polyp of stomach and duodenum CPT copyright 2019 [employer]. All rights reserved. The codes documented in this report are preliminary and upon coder review may  be revised to meet current compliance requirements. Hildred Laser, MD Hildred Laser, MD 01/04/2020 8:37:31 AM This report has been signed electronically. Number of Addenda: 0

## 2020-01-04 NOTE — Op Note (Addendum)
[facility] Patient Name: Sarah Hickman Procedure Date: 01/04/2020 7:53 AM MRN: [id number] Date of Birth: Feb 26, 1956 Attending MD: Hildred Laser , MD CSN: [id number] Age: 64 Admit Type: Outpatient Procedure:                Colonoscopy Indications:              Screening for colorectal malignant neoplasm Providers:                Hildred Laser, MD, Janeece Riggers, RN, Randa Spike,                            Technician Referring MD:             Doree Albee, MD Medicines:                Midazolam 1 mg IV Complications:            No immediate complications. Estimated Blood Loss:     Estimated blood loss was minimal. Procedure:                Pre-Anesthesia Assessment:                           - Prior to the procedure, a History and Physical                            was performed, and patient medications and                            allergies were reviewed. The patient's tolerance of                            previous anesthesia was also reviewed. The risks                            and benefits of the procedure and the sedation                            options and risks were discussed with the patient.                            All questions were answered, and informed consent                            was obtained. Prior Anticoagulants: The patient has                            taken no previous anticoagulant or antiplatelet                            agents except for NSAID medication. ASA Grade                            Assessment: I - A normal, healthy patient. After  reviewing the risks and benefits, the patient was                            deemed in satisfactory condition to undergo the                            procedure.                           After obtaining informed consent, the colonoscope                            was passed under direct vision. Throughout the                            procedure, the patient's blood  pressure, pulse, and                            oxygen saturations were monitored continuously. The                            [id number] was introduced through the                            anus and advanced to the the cecum, identified by                            appendiceal orifice and ileocecal valve. The                            colonoscopy was performed without difficulty. The                            patient tolerated the procedure well. The quality                            of the bowel preparation was excellent. The                            ileocecal valve, appendiceal orifice, and rectum                            were photographed. Scope In: 7:56:29 AM Scope Out: 8:21:07 AM Scope Withdrawal Time: 0 hours 18 minutes 36 seconds  Total Procedure Duration: 0 hours 24 minutes 38 seconds  Findings:      The perianal and digital rectal examinations were normal.      A 10 to 15 mm polyp was found in the ileocecal valve. The polyp was       multi-lobulated. The polyp was removed with a piecemeal technique using       a hot snare. Resection and retrieval were complete. Coagulation for       hemostasis using argon plasma was successful. The pathology specimen was       placed into Bottle Number 2.      A few diverticula were found  in the sigmoid colon.      The retroflexed view of the distal rectum and anal verge was normal and       showed no anal or rectal abnormalities. Impression:               - One 10 to 15 mm polyp at the ileocecal valve,                            removed piecemeal using a hot snare. Resected and                            retrieved. Base treated with argon plasma                            coagulation (APC).                           - Diverticulosis in the sigmoid colon. Moderate Sedation:      Moderate (conscious) sedation was administered by the endoscopy nurse       and supervised by the endoscopist. The following parameters were        monitored: oxygen saturation, heart rate, blood pressure, CO2       capnography and response to care. Total physician intraservice time was       24 minutes. Recommendation:           - Patient has a contact number available for                            emergencies. The signs and symptoms of potential                            delayed complications were discussed with the                            patient. Return to normal activities tomorrow.                            Written discharge instructions were provided to the                            patient.                           - High fiber diet today.                           - No aspirin, ibuprofen, naproxen, or other                            non-steroidal anti-inflammatory drugs for 7 days.                           - Await pathology results.                           - Repeat colonoscopy is recommended. The  colonoscopy date will be determined after pathology                            results from today's exam become available for                            review. Procedure Code(s):        --- Professional ---                           [id number], Colonoscopy, flexible; with removal of                            tumor(s), polyp(s), or other lesion(s) by snare                            technique                           99153, Moderate sedation; each additional 15                            minutes intraservice time                           G0500, Moderate sedation services provided by the                            same physician or other qualified health care                            professional performing a gastrointestinal                            endoscopic service that sedation supports,                            requiring the presence of an independent trained                            observer to assist in the monitoring of the                            patient's level of  consciousness and physiological                            status; initial 15 minutes of intra-service time;                            patient age 58 years or older (additional time may                            be reported with [phone], as appropriate) Diagnosis Code(s):        --- Professional ---  Z12.11, Encounter for screening for malignant                            neoplasm of colon                           K63.5, Polyp of colon                           K57.30, Diverticulosis of large intestine without                            perforation or abscess without bleeding CPT copyright 2019 [employer]. All rights reserved. The codes documented in this report are preliminary and upon coder review may  be revised to meet current compliance requirements. Hildred Laser, MD Hildred Laser, MD 01/04/2020 8:44:46 AM This report has been signed electronically. Number of Addenda: 0

## 2020-01-04 NOTE — H&P (Signed)
Sarah Hickman is an 64 y.o. female.   Chief Complaint: Patient is here for esophagogastroduodenoscopy and colonoscopy. HPI: Patient is 64 year old Caucasian female was intermittent throat burning and feeling of cording in her throat without heartburn or regurgitation.  She has tried 2 different PPIs but did not see any benefit.  The symptoms happen intermittently and she may have gone for a month.  She denies dysphagia nausea or vomiting.  She had barium study in 2018 which revealed mild age-related esophageal dysmotility but no evidence of stricture or hiatal hernia.  She denies change in bowel habits or rectal bleeding.  Last screening colonoscopy was normal in May 2010. Family history is negative for CRC.  Past Medical History:  Diagnosis Date  . GERD (gastroesophageal reflux disease) 02/25/2017  . Hyperlipidemia     Past Surgical History:  Procedure Laterality Date  . PARTIAL HYSTERECTOMY     one ovary is left (rt). endometriosis    Family History  Problem Relation Age of Onset  . Rheum arthritis Mother   . Heart attack Mother   . Diabetes Father   . Hypertension Brother   . Heart attack Paternal Grandfather    Social History:  reports that she has never smoked. She has never used smokeless tobacco. She reports current alcohol use of about 2.0 standard drinks of alcohol per week. She reports that she does not use drugs.  Allergies:  Allergies  Allergen Reactions  . Other     NO BLOOD PRODUCTS!!!!!!JEHOVAH WITNESS    Medications Prior to Admission  Medication Sig Dispense Refill  . Ascorbic Acid (VITAMIN C) 1000 MG tablet Take 1,000 mg by mouth daily as needed (cold symptoms).     Marland Kitchen ibuprofen (ADVIL) 200 MG tablet Take 400 mg by mouth every 8 (eight) hours as needed (for pain.).    Marland Kitchen MAGNESIUM CITRATE PO Take 400 mg by mouth at bedtime.    . Melatonin 5 MG TABS Take 10 mg by mouth at bedtime as needed (SLEEP).    . Omega-3 Fatty Acids (FISH OIL PO) Take 2,000 mg by mouth  daily.    . Probiotic Product (PROBIOTIC PO) Take 1 capsule by mouth 3 (three) times a week.    Manus Gunning BOWEL PREP KIT 17.5-3.13-1.6 GM/177ML SOLN Take 354 mLs by mouth once.     . Vitamin D-Vitamin K (D3 + K2 DOTS PO) Take 1 tablet by mouth daily.      No results found for this or any previous visit (from the past 48 hour(s)). No results found.  Review of Systems  Blood pressure 110/79, pulse 90, temperature 98.2 F (36.8 C), temperature source Oral, resp. rate 15, height _0  (1.473 m), SpO2 95 %. Physical Exam  Constitutional: She appears well-developed and well-nourished.  HENT:  Mouth/Throat: Oropharynx is clear and moist.  Eyes: Conjunctivae are normal. No scleral icterus.  Neck: No thyromegaly present.  Cardiovascular: Normal rate, regular rhythm and normal heart sounds.  No murmur heard. Respiratory: Effort normal and breath sounds normal.  GI: Soft. She exhibits no distension and no mass. There is no abdominal tenderness.  Musculoskeletal:        General: No edema.  Lymphadenopathy:    She has no cervical adenopathy.  Neurological: She is alert.  Skin: Skin is warm and dry.     Assessment/Plan Atypical GERD symptoms monitor response of therapy. Diagnostic esophagogastroduodenoscopy and average risk screening colonoscopy.  Hildred Laser, MD 01/04/2020, 7:34 AM

## 2020-01-05 LAB — SURGICAL PATHOLOGY

## 2020-04-14 ENCOUNTER — Ambulatory Visit (INDEPENDENT_AMBULATORY_CARE_PROVIDER_SITE_OTHER): Payer: Medicare Other | Admitting: Nurse Practitioner

## 2020-10-17 ENCOUNTER — Encounter (INDEPENDENT_AMBULATORY_CARE_PROVIDER_SITE_OTHER): Payer: Medicare Other | Admitting: Internal Medicine

## 2020-10-27 ENCOUNTER — Encounter (INDEPENDENT_AMBULATORY_CARE_PROVIDER_SITE_OTHER): Payer: Medicare Other | Admitting: Nurse Practitioner

## 2020-10-27 DIAGNOSIS — B351 Tinea unguium: Secondary | ICD-10-CM | POA: Diagnosis not present

## 2020-10-27 DIAGNOSIS — L821 Other seborrheic keratosis: Secondary | ICD-10-CM | POA: Diagnosis not present

## 2020-10-27 DIAGNOSIS — L84 Corns and callosities: Secondary | ICD-10-CM | POA: Diagnosis not present

## 2020-12-01 DIAGNOSIS — B351 Tinea unguium: Secondary | ICD-10-CM | POA: Diagnosis not present

## 2021-01-06 DIAGNOSIS — B351 Tinea unguium: Secondary | ICD-10-CM | POA: Diagnosis not present

## 2021-01-06 DIAGNOSIS — Z79899 Other long term (current) drug therapy: Secondary | ICD-10-CM | POA: Diagnosis not present

## 2021-02-01 DIAGNOSIS — Z1231 Encounter for screening mammogram for malignant neoplasm of breast: Secondary | ICD-10-CM | POA: Diagnosis not present

## 2021-04-17 ENCOUNTER — Other Ambulatory Visit: Payer: Self-pay

## 2021-04-17 ENCOUNTER — Ambulatory Visit (INDEPENDENT_AMBULATORY_CARE_PROVIDER_SITE_OTHER): Payer: Medicare Other | Admitting: Internal Medicine

## 2021-04-17 ENCOUNTER — Encounter (INDEPENDENT_AMBULATORY_CARE_PROVIDER_SITE_OTHER): Payer: Self-pay | Admitting: Internal Medicine

## 2021-04-17 VITALS — BP 121/81 | HR 91 | Temp 97.3°F | Resp 18 | Ht <= 58 in | Wt 138.8 lb

## 2021-04-17 DIAGNOSIS — M79671 Pain in right foot: Secondary | ICD-10-CM

## 2021-04-17 DIAGNOSIS — M79672 Pain in left foot: Secondary | ICD-10-CM

## 2021-04-17 NOTE — Progress Notes (Signed)
Metrics: Intervention Frequency ACO  Documented Smoking Status Yearly  Screened one or more times in 24 months  Cessation Counseling or  Active cessation medication Past 24 months  Past 24 months   Guideline developer: UpToDate (See UpToDate for funding source) Date Released: 2014       Wellness Office Visit  Subjective:  Patient ID: Sarah Hickman, female    DOB: 09-15-1956  Age: 65 y.o. MRN: [id number]  CC: Bilateral foot pain HPI  This lady comes to our office after a very long hiatus.  We have not seen her since December 2020. She describes swelling in the ankle area on both feet that she just noticed but also describes painful feet especially when she wakes up in the morning and they tend to improve throughout the day. Past Medical History:  Diagnosis Date   GERD (gastroesophageal reflux disease) 02/25/2017   Hyperlipidemia    Past Surgical History:  Procedure Laterality Date   BIOPSY  01/04/2020   Procedure: BIOPSY;  Surgeon: Rogene Houston, MD;  Location: [facility];  Service: Endoscopy;;   COLONOSCOPY N/A 01/04/2020   Procedure: COLONOSCOPY;  Surgeon: Rogene Houston, MD;  Location: [facility];  Service: Endoscopy;  Laterality: N/A;  12:45-office moved to 3/15 @ 7:30am   ESOPHAGOGASTRODUODENOSCOPY N/A 01/04/2020   Procedure: ESOPHAGOGASTRODUODENOSCOPY (EGD);  Surgeon: Rogene Houston, MD;  Location: [facility];  Service: Endoscopy;  Laterality: N/A;   PARTIAL HYSTERECTOMY     one ovary is left (rt). endometriosis   POLYPECTOMY  01/04/2020   Procedure: POLYPECTOMY;  Surgeon: Rogene Houston, MD;  Location: [facility];  Service: Endoscopy;;     Family History  Problem Relation Age of Onset   Rheum arthritis Mother    Heart attack Mother    Diabetes Father    Hypertension Brother    Heart attack Paternal Grandfather     Social History   Social History Narrative   Not on file   Social History   Tobacco Use   Smoking status: Never   Smokeless  tobacco: Never  Substance Use Topics   Alcohol use: Yes    Alcohol/week: 2.0 standard drinks    Types: 1 Glasses of wine, 1 Cans of beer per week    Comment: occasional (3-4 nights per week/wine)    Current Meds  Medication Sig   Ascorbic Acid (VITAMIN C) 1000 MG tablet Take 1,000 mg by mouth daily as needed (cold symptoms).    esomeprazole (NEXIUM) 40 MG capsule Take 1 capsule (40 mg total) by mouth daily before breakfast.   ibuprofen (ADVIL) 200 MG tablet Take 2 tablets (400 mg total) by mouth every 8 (eight) hours as needed (for pain.).   MAGNESIUM CITRATE PO Take 400 mg by mouth at bedtime.   Melatonin 5 MG TABS Take 10 mg by mouth at bedtime as needed (SLEEP).   Omega-3 Fatty Acids (FISH OIL PO) Take 2,000 mg by mouth daily.   Probiotic Product (PROBIOTIC PO) Take 1 capsule by mouth 3 (three) times a week.   SUPREP BOWEL PREP KIT 17.5-3.13-1.6 GM/177ML SOLN Take 354 mLs by mouth once.    terbinafine (LAMISIL) 250 MG tablet Take 1 tablet by mouth daily.   Vitamin D-Vitamin K (D3 + K2 DOTS PO) Take 1 tablet by mouth daily.       Objective:   Today's Vitals: BP 121/81 (BP Location: Right Arm, Patient Position: Sitting, Cuff Size: Small)   Pulse 91   Temp (!) 97.3  F (36.3 C) (Temporal)   Resp 18   Ht _0  (1.473 m)   Wt 138 lb 12.8 oz (63 kg)   SpO2 99%   BMI 29.01 kg/m  Vitals with BMI 04/17/2021 01/04/2020 01/04/2020  Height _1  - _2   Weight 138 lbs 13 oz - (No Data)  BMI 91.22 - -  Systolic 583 462 194  Diastolic 81 73 79  Pulse 91 84 90     Physical Exam  She looks systemically well.  The swelling that she speaks of may be fatty tissue near her lateral malleolus bilaterally.  She does not appear to have any obvious tenderness in the plantar aspect of both feet at the present time.     Assessment   1. Pain in both feet       Tests ordered Orders Placed This Encounter  Procedures   Ambulatory referral to Podiatry      Plan: 1.  I will  refer to podiatry regarding symptoms in her feet. 2.  She wants to schedule an annual physical exam and I will get her to come back and see Judson Roch for this.    No orders of the defined types were placed in this encounter.   Doree Albee, MD

## 2021-04-26 ENCOUNTER — Ambulatory Visit: Payer: Medicare Other | Admitting: Podiatry

## 2021-04-26 ENCOUNTER — Other Ambulatory Visit: Payer: Self-pay | Admitting: Podiatry

## 2021-04-26 ENCOUNTER — Other Ambulatory Visit: Payer: Self-pay

## 2021-04-26 ENCOUNTER — Ambulatory Visit (INDEPENDENT_AMBULATORY_CARE_PROVIDER_SITE_OTHER): Payer: Medicare Other

## 2021-04-26 DIAGNOSIS — M722 Plantar fascial fibromatosis: Secondary | ICD-10-CM

## 2021-04-27 ENCOUNTER — Encounter: Payer: Self-pay | Admitting: Podiatry

## 2021-04-27 NOTE — Progress Notes (Signed)
Subjective:  Patient ID: Sarah Hickman, female    DOB: 12-31-1955,  MRN: [id number]  Chief Complaint  Patient presents with   Foot Pain    Bilateral foot pain  PT stated that the pain is worse in the morning and after sitting for a long time  Nail fungus     65 y.o. female presents with the above complaint.  Patient presents with complaint of bilateral midfoot Planter fasciitis pain.  Patient states the pain is worse in the morning after standing for long time.  Patient states that for quite some time.  She started soaking the counter treatments none of which has helped.  She would like to discuss treatment options for this.  She has not seen and was prior to seeing me.  She does not do any injection.  She presents to discuss Invasive conservative treatment options for this.  Her pain is mild in nature   Review of Systems: Negative except as noted in the HPI. Denies N/V/F/Ch.  Past Medical History:  Diagnosis Date   GERD (gastroesophageal reflux disease) 02/25/2017   Hyperlipidemia     Current Outpatient Medications:    Ascorbic Acid (VITAMIN C) 1000 MG tablet, Take 1,000 mg by mouth daily as needed (cold symptoms). , Disp: , Rfl:    esomeprazole (NEXIUM) 40 MG capsule, Take 1 capsule (40 mg total) by mouth daily before breakfast., Disp: 30 capsule, Rfl: 5   ibuprofen (ADVIL) 200 MG tablet, Take 2 tablets (400 mg total) by mouth every 8 (eight) hours as needed (for pain.)., Disp: 30 tablet, Rfl: 0   MAGNESIUM CITRATE PO, Take 400 mg by mouth at bedtime., Disp: , Rfl:    Melatonin 5 MG TABS, Take 10 mg by mouth at bedtime as needed (SLEEP)., Disp: , Rfl:    Omega-3 Fatty Acids (FISH OIL PO), Take 2,000 mg by mouth daily., Disp: , Rfl:    Probiotic Product (PROBIOTIC PO), Take 1 capsule by mouth 3 (three) times a week., Disp: , Rfl:    SUPREP BOWEL PREP KIT 17.5-3.13-1.6 GM/177ML SOLN, Take 354 mLs by mouth once. , Disp: , Rfl:    terbinafine (LAMISIL) 250 MG tablet, Take 1 tablet by  mouth daily., Disp: , Rfl:    Vitamin D-Vitamin K (D3 + K2 DOTS PO), Take 1 tablet by mouth daily., Disp: , Rfl:   Social History   Tobacco Use  Smoking Status Never  Smokeless Tobacco Never    Allergies  Allergen Reactions   Other     NO BLOOD PRODUCTS!!!!!!JEHOVAH WITNESS   Objective:  There were no vitals filed for this visit. There is no height or weight on file to calculate BMI. Constitutional Well developed. Well nourished.  Vascular Dorsalis pedis pulses palpable bilaterally. Posterior tibial pulses palpable bilaterally. Capillary refill normal to all digits.  No cyanosis or clubbing noted. Pedal hair growth normal.  Neurologic Normal speech. Oriented to person, place, and time. Epicritic sensation to light touch grossly present bilaterally.  Dermatologic Nails well groomed and normal in appearance. No open wounds. No skin lesions.  Orthopedic: Normal joint ROM without pain or crepitus bilaterally. No visible deformities. Tender to palpation at the calcaneal tuber bilaterally. No pain with calcaneal squeeze bilaterally. Ankle ROM diminished range of motion bilaterally. Silfverskiold Test: positive bilaterally.   Radiographs: Taken and reviewed. No acute fractures or dislocations. No evidence of stress fracture.  Plantar heel spur absent. Posterior heel spur absent.   Assessment:   1. Bilateral plantar fasciitis  Plan:  Patient was evaluated and treated and all questions answered.  Plantar Fasciitis, bilaterally midfoot -Clinically educated her on the x-rays and discussed everything in extensive detail.  I discussed etiology of plantar fasciitis.  I discussed icing stretching elevation in extensive detail.  Given that her pain is only mild in nature with corticosteroid injection and bracing for now.  If it continues to get worse we will discuss treatment options for that.  I discussed shoe gear modification as well in extensive detail.  She states  understanding  No follow-ups on file.

## 2021-05-18 ENCOUNTER — Telehealth (INDEPENDENT_AMBULATORY_CARE_PROVIDER_SITE_OTHER): Payer: Self-pay | Admitting: Nurse Practitioner

## 2021-05-18 ENCOUNTER — Ambulatory Visit (INDEPENDENT_AMBULATORY_CARE_PROVIDER_SITE_OTHER): Payer: Medicare Other | Admitting: Nurse Practitioner

## 2021-05-18 ENCOUNTER — Other Ambulatory Visit: Payer: Self-pay

## 2021-05-18 VITALS — BP 133/70 | Resp 18 | Ht <= 58 in | Wt 135.0 lb

## 2021-05-18 DIAGNOSIS — Z1329 Encounter for screening for other suspected endocrine disorder: Secondary | ICD-10-CM

## 2021-05-18 DIAGNOSIS — Z23 Encounter for immunization: Secondary | ICD-10-CM

## 2021-05-18 DIAGNOSIS — M25811 Other specified joint disorders, right shoulder: Secondary | ICD-10-CM | POA: Diagnosis not present

## 2021-05-18 DIAGNOSIS — Z131 Encounter for screening for diabetes mellitus: Secondary | ICD-10-CM

## 2021-05-18 DIAGNOSIS — R2242 Localized swelling, mass and lump, left lower limb: Secondary | ICD-10-CM

## 2021-05-18 DIAGNOSIS — R222 Localized swelling, mass and lump, trunk: Secondary | ICD-10-CM | POA: Diagnosis not present

## 2021-05-18 DIAGNOSIS — Z0001 Encounter for general adult medical examination with abnormal findings: Secondary | ICD-10-CM

## 2021-05-18 DIAGNOSIS — R2241 Localized swelling, mass and lump, right lower limb: Secondary | ICD-10-CM

## 2021-05-18 DIAGNOSIS — E663 Overweight: Secondary | ICD-10-CM | POA: Diagnosis not present

## 2021-05-18 DIAGNOSIS — E785 Hyperlipidemia, unspecified: Secondary | ICD-10-CM

## 2021-05-18 DIAGNOSIS — E559 Vitamin D deficiency, unspecified: Secondary | ICD-10-CM

## 2021-05-18 DIAGNOSIS — R7303 Prediabetes: Secondary | ICD-10-CM

## 2021-05-18 NOTE — Progress Notes (Signed)
Subjective:  Patient ID: Sarah Hickman, female    DOB: October 26, 1955  Age: 65 y.o. MRN: [id number]  CC:  Chief Complaint  Patient presents with   Annual Exam      HPI  This patient arrives today for the above.  She is here for her annual exam.  She is up-to-date with mammogram, COVID-19 vaccines except she is due for the second booster.  She does not know exact dates of her COVID-19 vaccines today.  She is up-to-date with tetanus.  She went to [facility] screening and had a bone density screening test completed which showed low probability of osteoporosis.  She would like to hold off on DEXA scan for now.  She did have an A1c collected at the life screen and it did show prediabetes.  This was collected about 9 months ago.  She also has multiple masses on her body.  She has 1 on each ankle, 1 to her right chest, 1 to her right shoulder.  She tells me they have been present for a while and have not seem to change in size or characteristic but she is concerned about these areas.  Otherwise she has no complaints today.  She is due for pneumonia vaccines, she is around unvaccinated children on a regular basis.  Past Medical History:  Diagnosis Date   GERD (gastroesophageal reflux disease) 02/25/2017   Hyperlipidemia       Family History  Problem Relation Age of Onset   Rheum arthritis Mother    Heart attack Mother    Diabetes Father    Hypertension Brother    Heart attack Paternal Grandfather     Social History   Social History Narrative   Not on file   Social History   Tobacco Use   Smoking status: Never   Smokeless tobacco: Never  Substance Use Topics   Alcohol use: Yes    Alcohol/week: 2.0 standard drinks    Types: 1 Glasses of wine, 1 Cans of beer per week    Comment: occasional (3-4 nights per week/wine)     Current Meds  Medication Sig   Ascorbic Acid (VITAMIN C) 1000 MG tablet Take 1,000 mg by mouth daily as needed (cold symptoms).    ibuprofen (ADVIL) 200 MG  tablet Take 2 tablets (400 mg total) by mouth every 8 (eight) hours as needed (for pain.).   MAGNESIUM CITRATE PO Take 400 mg by mouth at bedtime.   Melatonin 5 MG TABS Take 10 mg by mouth at bedtime as needed (SLEEP).   Omega-3 Fatty Acids (FISH OIL PO) Take 2,000 mg by mouth daily.   Probiotic Product (PROBIOTIC PO) Take 1 capsule by mouth 3 (three) times a week.   Vitamin D-Vitamin K (D3 + K2 DOTS PO) Take 1 tablet by mouth daily.   [DISCONTINUED] SUPREP BOWEL PREP KIT 17.5-3.13-1.6 GM/177ML SOLN Take 354 mLs by mouth once.     ROS:  Review of Systems  Eyes:  Negative for blurred vision.  Respiratory:  Negative for shortness of breath.   Cardiovascular:  Negative for chest pain.  Gastrointestinal:  Negative for abdominal pain.  Neurological:  Negative for dizziness and headaches.    Objective:   Today's Vitals: BP 133/70 (BP Location: Right Arm, Patient Position: Sitting, Cuff Size: Small)   Resp 18   Ht '4\' 10"'  (1.473 m)   Wt 135 lb (61.2 kg)   SpO2 98%   BMI 28.22 kg/m  Vitals with BMI 05/18/2021 04/17/2021 01/04/2020  Height '4\' 10"'  '4\' 10"'  -  Weight 135 lbs 138 lbs 13 oz -  BMI 95.18 84.16 -  Systolic 606 301 601  Diastolic 70 81 73  Pulse - 91 84     Physical Exam Vitals reviewed.  Constitutional:      Appearance: Normal appearance.  HENT:     Head: Normocephalic and atraumatic.     Right Ear: Tympanic membrane, ear canal and external ear normal.     Left Ear: Tympanic membrane, ear canal and external ear normal.  Eyes:     General:        Right eye: No discharge.        Left eye: No discharge.     Extraocular Movements: Extraocular movements intact.     Conjunctiva/sclera: Conjunctivae normal.     Pupils: Pupils are equal, round, and reactive to light.  Neck:     Vascular: No carotid bruit.  Cardiovascular:     Rate and Rhythm: Normal rate and regular rhythm.     Pulses: Normal pulses.     Heart sounds: Normal heart sounds. No murmur heard. Pulmonary:      Effort: Pulmonary effort is normal.     Breath sounds: Normal breath sounds.  Chest:  Breasts:    Right: No supraclavicular adenopathy.     Left: No supraclavicular adenopathy.     Comments: Chest exam deferred per patient preference Abdominal:     General: Abdomen is flat. Bowel sounds are normal. There is no distension.     Palpations: Abdomen is soft. There is no mass.     Tenderness: There is no abdominal tenderness.  Musculoskeletal:        General: No tenderness.     Cervical back: Neck supple. No muscular tenderness.     Right lower leg: No edema.     Left lower leg: No edema.  Lymphadenopathy:     Cervical: No cervical adenopathy.     Upper Body:     Right upper body: No supraclavicular adenopathy.     Left upper body: No supraclavicular adenopathy.  Skin:    General: Skin is warm and dry.          Comments: Lines indicate areas of well demarcated soft, movable, round masses that are consistent with lipomas  Neurological:     General: No focal deficit present.     Mental Status: She is alert and oriented to person, place, and time.     Motor: No weakness.     Gait: Gait normal.  Psychiatric:        Mood and Affect: Mood normal.        Behavior: Behavior normal.        Judgment: Judgment normal.         Assessment and Plan   1. Encounter for general adult medical examination with abnormal findings   2. Hyperlipidemia, unspecified hyperlipidemia type   3. Vitamin D deficiency disease   4. Overweight (BMI 25.0-29.9)   5. Screening for diabetes mellitus   6. Thyroid disorder screen   7. Need for vaccination against Streptococcus pneumoniae using pneumococcal conjugate vaccine 13   8. Prediabetes   9. Mass of chest   10. Mass of joint of right shoulder   11. Ankle mass, right   12. Ankle mass, left      Plan: 1.-6.,  8.  We will collect blood work for further evaluation today. 7.  We will administer PCV13 and she will be due  for PPSV23 next  year. 9.-12.  We will order ultrasound of masses of concern for further evaluation.    Tests ordered Orders Placed This Encounter  Procedures   US Soft Tissue Head/Neck (NON-THYROID)   US SOFT TISSUE LOWER EXTREMITY LIMITED RIGHT (NON-VASCULAR)   US Venous Img Lower Unilateral Left   Pneumococcal conjugate vaccine 13-valent IM   CBC   COMPLETE METABOLIC PANEL WITH GFR   Lipid panel   TSH   VITAMIN D 25 Hydroxy (Vit-D Deficiency, Fractures)   Hemoglobin A1c       No orders of the defined types were placed in this encounter.   Patient to follow-up in 6 to 12 weeks or sooner as needed.  In addition to doing her annual exam today also did an office visit to address her concerns.  Ailene Ards, NP

## 2021-05-18 NOTE — Patient Instructions (Signed)
Pneumococcal Conjugate Vaccine (PCV13): What You Need to Know 1. Why get vaccinated? Pneumococcal conjugate vaccine (PCV13) can prevent pneumococcal disease. Pneumococcal disease refers to any illness caused by pneumococcal bacteria. These bacteria can cause many types of illnesses, including pneumonia, which is an infection ofthe lungs. Pneumococcal bacteria are one of the most common causes of pneumonia. Besides pneumonia, pneumococcal bacteria can also cause: Ear infections Sinus infections Meningitis (infection of the tissue covering the brain and spinal cord) Bacteremia (infection of the blood) Anyone can get pneumococcal disease, but children under 2 years old, people with certain medical conditions, adults 65 years or older, and cigarettesmokers are at the highest risk. Most pneumococcal infections are mild. However, some can result in long-term problems, such as brain damage or hearing loss. Meningitis, bacteremia, andpneumonia caused by pneumococcal disease can be fatal. 2. PCV13 PCV13 protects against 13 types of bacteria that cause pneumococcal disease. Infants and young children usually need 4 doses of pneumococcal conjugate vaccine, at ages 2, 4, 6, and 12-15 months. Older children (through age 59 months) may be vaccinated if they did not receive the recommended doses. A dose of PCV13 is also recommended for adults and children 6 years or olderwith certain medical conditions if they did not already receive PCV13. This vaccine may be given to healthy adults 65 years or older who did not already receive PCV13, based on discussions between the patientand health care provider. 3. Talk with your health care provider Tell your vaccination provider if the person getting the vaccine: Has had an allergic reaction after a previous dose of PCV13, to an earlier pneumococcal conjugate vaccine known as PCV7, or to any vaccine containing diphtheria toxoid (for example, DTaP), or has any severe,  life-threatening allergies In some cases, your health care provider may decide to postpone PCV13vaccination until a future visit. People with minor illnesses, such as a cold, may be vaccinated. People who are moderately or severely ill should usually wait until they recover beforegetting PCV13. Your health care provider can give you more information. 4. Risks of a vaccine reaction Redness, swelling, pain, or tenderness where the shot is given, and fever, loss of appetite, fussiness (irritability), feeling tired, headache, and chills can happen after PCV13 vaccination. Young children may be at increased risk for seizures caused by fever after PCV13 if it is administered at the same time as inactivated influenza vaccine.Ask your health care provider for more information. People sometimes faint after medical procedures, including vaccination. Tellyour provider if you feel dizzy or have vision changes or ringing in the ears. As with any medicine, there is a very remote chance of a vaccine causing asevere allergic reaction, other serious injury, or death. 5. What if there is a serious problem? An allergic reaction could occur after the vaccinated person leaves the clinic. If you see signs of a severe allergic reaction (hives, swelling of the face and throat, difficulty breathing, a fast heartbeat, dizziness, or weakness), call 9-1-1 and get the person to the nearest hospital. For other signs that concern you, call your health care provider. Adverse reactions should be reported to the Vaccine Adverse Event Reporting System (VAERS). Your health care provider will usually file this report, or you can do it yourself. Visit the VAERS website at www.vaers.hhs.gov or call 1-800-822-7967. VAERS is only for reporting reactions, and VAERS staff members do not give medical advice. 6. The National Vaccine Injury Compensation Program The National Vaccine Injury Compensation Program (VICP) is a federal program that was  created to   compensate people who may have been injured by certain vaccines. Claims regarding alleged injury or death due to vaccination have a time limit for filing, which may be as short as two years. Visit the VICP website at www.hrsa.gov/vaccinecompensation or call 1-800-338-2382 to learn about the program and about filing a claim. 7. How can I learn more? Ask your health care provider. Call your local or state health department. Visit the website of the Food and Drug Administration (FDA) for vaccine package inserts and additional information at www.fda.gov/vaccines-blood-biologics/vaccines. Contact the Centers for Disease Control and Prevention (CDC): Call 1-800-232-4636 (1-800-CDC-INFO) or Visit CDC's website at www.cdc.gov/vaccines. Vaccine Information Statement PCV13 (05/27/2020) This information is not intended to replace advice given to you by your health care provider. Make sure you discuss any questions you have with your healthcare provider. Document Revised: 07/14/2020 Document Reviewed: 07/14/2020 Elsevier Patient Education  2022 Elsevier Inc.  

## 2021-05-18 NOTE — Telephone Encounter (Signed)
ORdered multiple ultrasounds of soft tissue masses in chest, shoulder, and each ankle. Please work on getting this approved with insurance and scheduled.

## 2021-05-19 LAB — LIPID PANEL
Cholesterol: 315 mg/dL — ABNORMAL HIGH (ref <200)
HDL: 103 mg/dL (ref >=50)
LDL Cholesterol (Calc): 186 mg/dL (calc) — ABNORMAL HIGH
Non-HDL Cholesterol (Calc): 212 mg/dL (calc) — ABNORMAL HIGH (ref <130)
Total CHOL/HDL Ratio: 3.1 (calc) (ref <5.0)
Triglycerides: 128 mg/dL (ref <150)

## 2021-05-19 LAB — CBC
HCT: 41 % (ref 35.0–45.0)
Hemoglobin: 13.9 g/dL (ref 11.7–15.5)
MCH: 30.1 pg (ref 27.0–33.0)
MCHC: 33.9 g/dL (ref 32.0–36.0)
MCV: 88.7 fL (ref 80.0–100.0)
MPV: 12.6 fL — ABNORMAL HIGH (ref 7.5–12.5)
Platelets: 170 10*3/uL (ref 140–400)
RBC: 4.62 10*6/uL (ref 3.80–5.10)
RDW: 13.1 % (ref 11.0–15.0)
WBC: 7 10*3/uL (ref 3.8–10.8)

## 2021-05-19 LAB — COMPLETE METABOLIC PANEL WITH GFR
AG Ratio: 1.6 (calc) (ref 1.0–2.5)
ALT: 25 U/L (ref 6–29)
AST: 24 U/L (ref 10–35)
Albumin: 4.6 g/dL (ref 3.6–5.1)
Alkaline phosphatase (APISO): 86 U/L (ref 37–153)
BUN: 17 mg/dL (ref 7–25)
CO2: 27 mmol/L (ref 20–32)
Calcium: 9.9 mg/dL (ref 8.6–10.4)
Chloride: 102 mmol/L (ref 98–110)
Creat: 0.58 mg/dL (ref 0.50–1.05)
Globulin: 2.9 g/dL (calc) (ref 1.9–3.7)
Glucose, Bld: 85 mg/dL (ref 65–99)
Potassium: 4.3 mmol/L (ref 3.5–5.3)
Sodium: 139 mmol/L (ref 135–146)
Total Bilirubin: 0.8 mg/dL (ref 0.2–1.2)
Total Protein: 7.5 g/dL (ref 6.1–8.1)
eGFR: 99 mL/min/{1.73_m2} (ref >=60)

## 2021-05-19 LAB — HEMOGLOBIN A1C
Hgb A1c MFr Bld: 5.6 % of total Hgb (ref <5.7)
Mean Plasma Glucose: 114 mg/dL
eAG (mmol/L): 6.3 mmol/L

## 2021-05-19 LAB — TSH: TSH: 1.92 mIU/L (ref 0.40–4.50)

## 2021-05-19 LAB — VITAMIN D 25 HYDROXY (VIT D DEFICIENCY, FRACTURES): Vit D, 25-Hydroxy: 28 ng/mL — ABNORMAL LOW (ref 30–100)

## 2021-05-23 NOTE — Telephone Encounter (Signed)
[place] is approved. Appt has been set and appt called to pt as well.

## 2021-05-25 ENCOUNTER — Ambulatory Visit (HOSPITAL_COMMUNITY)
Admission: RE | Admit: 2021-05-25 | Discharge: 2021-05-25 | Disposition: A | Discharge status: Home / Self Care | Payer: Medicare Other | Source: Ambulatory Visit | Attending: Nurse Practitioner | Admitting: Nurse Practitioner

## 2021-05-25 ENCOUNTER — Other Ambulatory Visit: Payer: Self-pay

## 2021-05-25 ENCOUNTER — Ambulatory Visit (HOSPITAL_COMMUNITY): Admission: RE | Admit: 2021-05-25 | Payer: Medicare Other | Source: Ambulatory Visit

## 2021-05-25 DIAGNOSIS — R2242 Localized swelling, mass and lump, left lower limb: Secondary | ICD-10-CM | POA: Insufficient documentation

## 2021-05-25 DIAGNOSIS — R2241 Localized swelling, mass and lump, right lower limb: Secondary | ICD-10-CM | POA: Insufficient documentation

## 2021-05-25 DIAGNOSIS — M25811 Other specified joint disorders, right shoulder: Secondary | ICD-10-CM

## 2021-05-25 DIAGNOSIS — R222 Localized swelling, mass and lump, trunk: Secondary | ICD-10-CM

## 2021-05-25 DIAGNOSIS — D1779 Benign lipomatous neoplasm of other sites: Secondary | ICD-10-CM | POA: Diagnosis not present

## 2021-05-25 DIAGNOSIS — D1723 Benign lipomatous neoplasm of skin and subcutaneous tissue of right leg: Secondary | ICD-10-CM | POA: Diagnosis not present

## 2021-05-31 ENCOUNTER — Other Ambulatory Visit (INDEPENDENT_AMBULATORY_CARE_PROVIDER_SITE_OTHER): Payer: Self-pay | Admitting: Nurse Practitioner

## 2021-05-31 DIAGNOSIS — D179 Benign lipomatous neoplasm, unspecified: Secondary | ICD-10-CM

## 2021-05-31 DIAGNOSIS — R222 Localized swelling, mass and lump, trunk: Secondary | ICD-10-CM

## 2021-05-31 NOTE — Progress Notes (Signed)
I have ordered a CT scan of the chest for this patient.  This is for further evaluation of mass on her chest.  She has already undergone an ultrasound which did not show any obvious abnormalities and radiologist recommend additional imaging if further investigation was required.  Please see if this can be approved by insurance and then scheduled as soon as possible.  Thank you.

## 2021-05-31 NOTE — Progress Notes (Signed)
Patient called me to tell me she has a couple areas of concern in her lower extremities that she did not disclose at her last office visit with me.  She tells me that the are similar masses like the one that was located in her chest.  We have ordered CT scan for further evaluation of the mass of her chest, and I Georgina Peer go ahead and order referral to general surgeon so they can consider biopsying these areas we are waiting for the CT scan of the chest to be completed.  Referral ordered and patient expresses her understanding.  She was told to call the office within 1 week if she has not heard about appointments for the CT scan and/or general surgeon consult.

## 2021-07-03 ENCOUNTER — Ambulatory Visit (HOSPITAL_COMMUNITY)
Admission: RE | Admit: 2021-07-03 | Discharge: 2021-07-03 | Disposition: A | Discharge status: Home / Self Care | Payer: Medicare Other | Source: Ambulatory Visit | Attending: Nurse Practitioner | Admitting: Nurse Practitioner

## 2021-07-03 ENCOUNTER — Other Ambulatory Visit: Payer: Self-pay

## 2021-07-03 DIAGNOSIS — R222 Localized swelling, mass and lump, trunk: Secondary | ICD-10-CM | POA: Diagnosis not present

## 2021-07-03 DIAGNOSIS — J984 Other disorders of lung: Secondary | ICD-10-CM | POA: Diagnosis not present

## 2021-07-03 LAB — POCT I-STAT CREATININE: Creatinine, Ser: 0.7 mg/dL (ref 0.44–1.00)

## 2021-07-03 MED ORDER — IOHEXOL 350 MG/ML SOLN
60.0000 mL | Freq: Once | INTRAVENOUS | Status: AC | PRN
  Administered 2021-07-03: 60 mL via INTRAVENOUS

## 2021-07-06 ENCOUNTER — Ambulatory Visit: Payer: Medicare Other | Admitting: General Surgery

## 2021-07-13 ENCOUNTER — Ambulatory Visit (INDEPENDENT_AMBULATORY_CARE_PROVIDER_SITE_OTHER): Payer: Medicare Other | Admitting: Nurse Practitioner

## 2021-08-23 ENCOUNTER — Ambulatory Visit: Payer: Medicare Other | Admitting: Internal Medicine

## 2021-11-09 ENCOUNTER — Encounter: Payer: Self-pay | Admitting: Internal Medicine

## 2021-11-09 ENCOUNTER — Other Ambulatory Visit: Payer: Self-pay

## 2021-11-09 ENCOUNTER — Ambulatory Visit (INDEPENDENT_AMBULATORY_CARE_PROVIDER_SITE_OTHER): Payer: Medicare Other | Admitting: Internal Medicine

## 2021-11-09 VITALS — BP 128/88 | HR 59 | Resp 18 | Ht <= 58 in | Wt 136.1 lb

## 2021-11-09 DIAGNOSIS — M722 Plantar fascial fibromatosis: Secondary | ICD-10-CM

## 2021-11-09 DIAGNOSIS — E782 Mixed hyperlipidemia: Secondary | ICD-10-CM

## 2021-11-09 DIAGNOSIS — M25473 Effusion, unspecified ankle: Secondary | ICD-10-CM | POA: Diagnosis not present

## 2021-11-09 DIAGNOSIS — D170 Benign lipomatous neoplasm of skin and subcutaneous tissue of head, face and neck: Secondary | ICD-10-CM

## 2021-11-09 MED ORDER — IBUPROFEN 600 MG PO TABS: 600.0000 mg | ORAL_TABLET | Freq: Three times a day (TID) | ORAL | 0 refills | Status: DC | PRN

## 2021-11-09 NOTE — Assessment & Plan Note (Signed)
Could be from ankle arthritis, although X-ray from podiatry office did not comment about arthritis Ibuprofen PRN

## 2021-11-09 NOTE — Assessment & Plan Note (Signed)
Last lipid profile reviewed She takes omega-3 2000 mg daily Check lipid profile

## 2021-11-09 NOTE — Progress Notes (Signed)
New Patient Office Visit  Subjective:  Patient ID: Sarah Hickman, female    DOB: [birth year]  Age: 66 y.o. MRN: [id number]  CC:  Chief Complaint  Patient presents with   New Patient (Initial Visit)    New patient was seeing gosrani both heels have been hurting its worse in the afternoon has saw podiatrist    HPI Sarah Hickman is a 66 y.o. female with past medical history of GERD and HLD who presents for establishing care.  She complains of bilateral heel pain, which is worse upon walking and has been present for the last few months.  She has seen podiatrist, and was told to continue conservative measures including wide based shoes and icing, which has not helped much. She has also noticed swelling near b/l ankles.  She reports noticing masses in the upper chest wall area and left side of neck in the clavicular area. Denies any pain or recent change in size of the mass.  Her last lipid profile showed very high LDL, but she was advised to follow low cholesterol diet. She has also been taking Omega-3. Does not have h/o DM, CVA or CAD.    Past Medical History:  Diagnosis Date   GERD (gastroesophageal reflux disease) 02/25/2017   Hyperlipidemia     Past Surgical History:  Procedure Laterality Date   BIOPSY  01/04/2020   Procedure: BIOPSY;  Surgeon: Rogene Houston, MD;  Location: [facility];  Service: Endoscopy;;   COLONOSCOPY N/A 01/04/2020   Procedure: COLONOSCOPY;  Surgeon: Rogene Houston, MD;  Location: [facility];  Service: Endoscopy;  Laterality: N/A;  12:45-office moved to 3/15 @ 7:30am   ESOPHAGOGASTRODUODENOSCOPY N/A 01/04/2020   Procedure: ESOPHAGOGASTRODUODENOSCOPY (EGD);  Surgeon: Rogene Houston, MD;  Location: [facility];  Service: Endoscopy;  Laterality: N/A;   PARTIAL HYSTERECTOMY     one ovary is left (rt). endometriosis   POLYPECTOMY  01/04/2020   Procedure: POLYPECTOMY;  Surgeon: Rogene Houston, MD;  Location: [facility];  Service: Endoscopy;;     Family History  Problem Relation Age of Onset   Rheum arthritis Mother    Heart attack Mother    Diabetes Father    Hypertension Brother    Heart attack Paternal Grandfather     Social History   Socioeconomic History   Marital status: Married    Spouse name: Not on file   Number of children: Not on file   Years of education: Not on file   Highest education level: Not on file  Occupational History   Occupation: Retired    Comment: Ran a daycare  Tobacco Use   Smoking status: Never   Smokeless tobacco: Never  Vaping Use   Vaping Use: Never used  Substance and Sexual Activity   Alcohol use: Yes    Alcohol/week: 2.0 standard drinks    Types: 1 Glasses of wine, 1 Cans of beer per week    Comment: occasional (3-4 nights per week/wine)   Drug use: No    Comment: CBD oil as needed for sleep   Sexual activity: Not on file  Other Topics Concern   Not on file  Social History Narrative   Not on file   Social Determinants of Health   Financial Resource Strain: Not on file  Food Insecurity: Not on file  Transportation Needs: Not on file  Physical Activity: Not on file  Stress: Not on file  Social Connections: Not on file  Intimate Partner Violence: Not  on file    ROS Review of Systems  Constitutional:  Negative for chills and fever.  HENT:  Negative for congestion, sinus pressure, sinus pain and sore throat.   Eyes:  Negative for pain and discharge.  Respiratory:  Negative for cough and shortness of breath.   Cardiovascular:  Negative for chest pain and palpitations.  Gastrointestinal:  Negative for abdominal pain, constipation, diarrhea, nausea and vomiting.  Endocrine: Negative for polydipsia and polyuria.  Genitourinary:  Negative for dysuria and hematuria.  Musculoskeletal:  Negative for neck pain and neck stiffness.       B/l heel pain  Skin:  Negative for rash.  Neurological:  Negative for dizziness and weakness.  Psychiatric/Behavioral:  Negative for  agitation and behavioral problems.    Objective:   Today's Vitals: BP 128/88 (BP Location: Right Arm, Patient Position: Sitting, Cuff Size: Normal)    Pulse (!) 59    Resp 18    Ht _0  (1.473 m)    Wt 136 lb 1.9 oz (61.7 kg)    SpO2 100%    BMI 28.45 kg/m   Physical Exam Vitals reviewed.  Constitutional:      General: She is not in acute distress.    Appearance: She is not diaphoretic.  HENT:     Head: Normocephalic and atraumatic.     Nose: Nose normal.     Mouth/Throat:     Mouth: Mucous membranes are moist.  Eyes:     General: No scleral icterus.    Extraocular Movements: Extraocular movements intact.  Cardiovascular:     Rate and Rhythm: Normal rate and regular rhythm.     Pulses: Normal pulses.     Heart sounds: Normal heart sounds. No murmur heard. Pulmonary:     Breath sounds: Normal breath sounds. No wheezing or rales.  Abdominal:     Palpations: Abdomen is soft.     Tenderness: There is no abdominal tenderness.  Musculoskeletal:     Cervical back: Neck supple. No tenderness.     Right lower leg: No edema.     Left lower leg: No edema.  Skin:    General: Skin is warm.     Findings: No rash.     Comments: Circular mass in left clavicular area - about 1 cm in diameter  Neurological:     General: No focal deficit present.     Mental Status: She is alert and oriented to person, place, and time.  Psychiatric:        Mood and Affect: Mood normal.        Behavior: Behavior normal.    Assessment & Plan:   Problem List Items Addressed This Visit       Musculoskeletal and Integument   Plantar fasciitis, bilateral    Bilateral heel pain, worse with walking Has been evaluated by podiatry Ibuprofen as needed prescribed Advised to wear ankle brace for plantar fascitis at nighttime Advised to contact podiatry if persistent heel pain      Relevant Medications   ibuprofen (ADVIL) 600 MG tablet   Lipoma of neck    Over left side of neck, reassured it being  benign She is concerned about a mass in upper chest wall area, which seems like fat deposition, no distinct mass noted She has had CT chest for further evaluation, which did not show any mass.        Other   Hyperlipidemia - Primary    Last lipid profile reviewed She takes omega-3  2000 mg daily Check lipid profile      Relevant Orders   CMP14+EGFR   Lipid Profile   Ankle swelling    Could be from ankle arthritis, although X-ray from podiatry office did not comment about arthritis Ibuprofen PRN       Outpatient Encounter Medications as of 11/09/2021  Medication Sig   ibuprofen (ADVIL) 600 MG tablet Take 1 tablet (600 mg total) by mouth every 8 (eight) hours as needed.   MAGNESIUM CITRATE PO Take 400 mg by mouth at bedtime.   Melatonin 5 MG TABS Take 10 mg by mouth at bedtime as needed (SLEEP).   Probiotic Product (PROBIOTIC PO) Take 1 capsule by mouth 3 (three) times a week.   Vitamin D-Vitamin K (D3 + K2 DOTS PO) Take 1 tablet by mouth daily.   Ascorbic Acid (VITAMIN C) 1000 MG tablet Take 1,000 mg by mouth daily as needed (cold symptoms).  (Patient not taking: Reported on 11/09/2021)   Omega-3 Fatty Acids (FISH OIL PO) Take 2,000 mg by mouth daily. (Patient not taking: Reported on 11/09/2021)   [DISCONTINUED] ibuprofen (ADVIL) 200 MG tablet Take 2 tablets (400 mg total) by mouth every 8 (eight) hours as needed (for pain.). (Patient not taking: Reported on 11/09/2021)   No facility-administered encounter medications on file as of 11/09/2021.    Follow-up: Return in about 7 months (around 06/09/2022) for Annual physical.   Lindell Spar, MD

## 2021-11-09 NOTE — Patient Instructions (Signed)
Please start taking Ibuprofen as needed for swelling and pain.  Please use ankle brace for plantar fasciitis.  Continue to take Omega-3 for now.

## 2021-11-09 NOTE — Assessment & Plan Note (Signed)
Over left side of neck, reassured it being benign She is concerned about a mass in upper chest wall area, which seems like fat deposition, no distinct mass noted She has had CT chest for further evaluation, which did not show any mass.

## 2021-11-09 NOTE — Assessment & Plan Note (Signed)
Bilateral heel pain, worse with walking Has been evaluated by podiatry Ibuprofen as needed prescribed Advised to wear ankle brace for plantar fascitis at nighttime Advised to contact podiatry if persistent heel pain

## 2021-11-10 LAB — CMP14+EGFR
ALT: 20 IU/L (ref 0–32)
AST: 20 IU/L (ref 0–40)
Albumin/Globulin Ratio: 1.9 (ref 1.2–2.2)
Albumin: 4.7 g/dL (ref 3.8–4.8)
Alkaline Phosphatase: 97 IU/L (ref 44–121)
BUN/Creatinine Ratio: 23 (ref 12–28)
BUN: 15 mg/dL (ref 8–27)
Bilirubin Total: 0.6 mg/dL (ref 0.0–1.2)
CO2: 23 mmol/L (ref 20–29)
Calcium: 9.9 mg/dL (ref 8.7–10.3)
Chloride: 103 mmol/L (ref 96–106)
Creatinine, Ser: 0.64 mg/dL (ref 0.57–1.00)
Globulin, Total: 2.5 g/dL (ref 1.5–4.5)
Glucose: 85 mg/dL (ref 70–99)
Potassium: 4.4 mmol/L (ref 3.5–5.2)
Sodium: 142 mmol/L (ref 134–144)
Total Protein: 7.2 g/dL (ref 6.0–8.5)
eGFR: 97 mL/min/{1.73_m2} (ref >59)

## 2021-11-10 LAB — LIPID PANEL
Chol/HDL Ratio: 2.9 ratio (ref 0.0–4.4)
Cholesterol, Total: 312 mg/dL — ABNORMAL HIGH (ref 100–199)
HDL: 109 mg/dL (ref >39)
LDL Chol Calc (NIH): 186 mg/dL — ABNORMAL HIGH (ref 0–99)
Triglycerides: 105 mg/dL (ref 0–149)
VLDL Cholesterol Cal: 17 mg/dL (ref 5–40)

## 2021-11-10 MED ORDER — ROSUVASTATIN CALCIUM 10 MG PO TABS: 10.0000 mg | ORAL_TABLET | Freq: Every day | ORAL | 1 refills | Status: DC

## 2021-11-10 NOTE — Addendum Note (Signed)
Addended byIhor Dow on: 11/10/2021 08:08 AM   Modules accepted: Orders

## 2021-12-13 ENCOUNTER — Ambulatory Visit: Payer: Medicare Other

## 2022-03-15 ENCOUNTER — Ambulatory Visit: Payer: Medicare Other | Admitting: Podiatry

## 2022-03-15 ENCOUNTER — Ambulatory Visit (INDEPENDENT_AMBULATORY_CARE_PROVIDER_SITE_OTHER): Payer: Medicare Other

## 2022-03-15 DIAGNOSIS — M7751 Other enthesopathy of right foot: Secondary | ICD-10-CM | POA: Diagnosis not present

## 2022-03-15 DIAGNOSIS — M722 Plantar fascial fibromatosis: Secondary | ICD-10-CM

## 2022-03-15 DIAGNOSIS — B351 Tinea unguium: Secondary | ICD-10-CM | POA: Diagnosis not present

## 2022-03-15 DIAGNOSIS — L603 Nail dystrophy: Secondary | ICD-10-CM | POA: Diagnosis not present

## 2022-03-15 MED ORDER — METHYLPREDNISOLONE 4 MG PO TBPK: ORAL_TABLET | ORAL | 0 refills | Status: DC

## 2022-03-18 NOTE — Progress Notes (Signed)
She presents today after having seen Dr. Posey Pronto for bilateral Planter fasciitis July of last year.  Currently she is complaining of posterior heel pain and itching to her bilateral arch.  She is also concerned about nail fungus bilateral great nails.  She states that she has tried Lamisil in the past.  States that it did nothing for her.  Objective: Vital signs are stable alert and oriented x3.  Pulses are palpable.  Neurologic sensorium is intact deep tendon reflexes are intact muscle strength is normal and symmetrical she does have tenderness with an area of fluctuance on the posterior aspect of the right calcaneus where the Achilles inserts superiorly and slightly laterally.  This appears to be a bursitis or an insertional Achilles tendinitis with some fluid retention.  She has no reproducible pain in the arch at this time though she does state that the itch occurs in the evenings and at nighttime.  When range of motion of all of her joints she does does have some tenderness generalized soreness about the feet.  Her nails are thick yellow dystrophic possibly mycotic particularly the hallux nails bilaterally.  Radiographs taken today demonstrate an osseously mature individual with a rectus foot type.  Mild hallux valgus deformities bilateral mild digital deformities bilateral.  Plantar distally oriented calcaneal heel spur soft tissue increase in density plantar fashion calcaneal insertion sites noted bilaterally.  Right heel posteriorly does demonstrate a slight thickening of the Achilles tendon and what appears to be a swelling or fluctuance in the posterior aspect of the Achilles beneath the skin.  This is more than likely a bursa or bursitis.  No other significant osseous abnormalities are identified.  No acute findings are noted.  Assessment: Posterior Achilles tendinitis with posterior retrocalcaneal bursitis.  Possible nail fungus but definitely nail dystrophy.  Cannot rule out neuropathy medial  longitudinal arch.  Capsulitis foot bilateral.  Plan: Discussed etiology pathology conservative surgical therapies at this point I injected dexamethasone to the posterior aspect of the right heel 2 mg was injected subcutaneously making sure not to inject into the tendon itself I do believe we were able to get into the bursa however.  We also took samples of the nail today to be sent for pathologic evaluation.  I started her on methylprednisolone to help alleviate any capsulitis or neuritis in the foot.  We will see how this works out for her.  I will follow-up with her in 1 month.  Hopefully the [facility]  pathology will be back by then.

## 2022-04-18 DIAGNOSIS — E559 Vitamin D deficiency, unspecified: Secondary | ICD-10-CM | POA: Diagnosis not present

## 2022-04-18 DIAGNOSIS — Z0000 Encounter for general adult medical examination without abnormal findings: Secondary | ICD-10-CM | POA: Diagnosis not present

## 2022-04-18 DIAGNOSIS — Z7689 Persons encountering health services in other specified circumstances: Secondary | ICD-10-CM | POA: Diagnosis not present

## 2022-04-18 DIAGNOSIS — B351 Tinea unguium: Secondary | ICD-10-CM | POA: Diagnosis not present

## 2022-04-18 DIAGNOSIS — K219 Gastro-esophageal reflux disease without esophagitis: Secondary | ICD-10-CM | POA: Diagnosis not present

## 2022-04-18 DIAGNOSIS — R0683 Snoring: Secondary | ICD-10-CM | POA: Diagnosis not present

## 2022-04-18 DIAGNOSIS — M79671 Pain in right foot: Secondary | ICD-10-CM | POA: Diagnosis not present

## 2022-04-18 DIAGNOSIS — K449 Diaphragmatic hernia without obstruction or gangrene: Secondary | ICD-10-CM | POA: Diagnosis not present

## 2022-04-18 DIAGNOSIS — M79672 Pain in left foot: Secondary | ICD-10-CM | POA: Diagnosis not present

## 2022-04-18 DIAGNOSIS — Z0001 Encounter for general adult medical examination with abnormal findings: Secondary | ICD-10-CM | POA: Diagnosis not present

## 2022-04-18 DIAGNOSIS — E785 Hyperlipidemia, unspecified: Secondary | ICD-10-CM | POA: Diagnosis not present

## 2022-04-19 ENCOUNTER — Ambulatory Visit: Payer: Medicare Other | Admitting: Podiatry

## 2022-04-19 ENCOUNTER — Encounter: Payer: Self-pay | Admitting: Podiatry

## 2022-04-19 DIAGNOSIS — B351 Tinea unguium: Secondary | ICD-10-CM

## 2022-04-19 NOTE — Progress Notes (Signed)
She presents today for follow-up of her onychomycosis and her pathology.  Objective: Vital signs are stable alert and oriented x3 no change in physical exam nail pathology was consistent with 2 different types of separate fights and Pseudomonas aeruginosa.  Assessment saprophytic nail disease.  Plan: Discussed laser therapy versus Sporanox.  At this point she would rather try laser she will be set up for that on a regular basis.

## 2022-06-08 ENCOUNTER — Encounter: Payer: Medicare Other | Admitting: Internal Medicine

## 2022-12-12 ENCOUNTER — Encounter (INDEPENDENT_AMBULATORY_CARE_PROVIDER_SITE_OTHER): Payer: Self-pay | Admitting: *Deleted

## 2022-12-27 ENCOUNTER — Telehealth (INDEPENDENT_AMBULATORY_CARE_PROVIDER_SITE_OTHER): Payer: Self-pay | Admitting: Gastroenterology

## 2022-12-27 NOTE — Telephone Encounter (Signed)
Who is your primary care physician: Dr.Shawn Lazoff  Reasons for the colonoscopy: Follow up  Have you had a colonoscopy before?  Yes march 2021  Do you have family history of colon cancer? no  Previous colonoscopy with polyps removed? Yes march 2021  Do you have a history colorectal cancer?   no  Are you diabetic? If yes, Type 1 or Type 2?    no  Do you have a prosthetic or mechanical heart valve? no  Do you have a pacemaker/defibrillator?   no  Have you had endocarditis/atrial fibrillation? no  Have you had joint replacement within the last 12 months?  no  Do you tend to be constipated or have to use laxatives? no  Do you have any history of drugs or alchohol?  no  Do you use supplemental oxygen?  no  Have you had a stroke or heart attack within the last 6 months?no  Do you take weight loss medication? no  For female patients: have you had a hysterectomy?  yes                                     are you post menopausal?       yes                                            do you still have your menstrual cycle? no      Do you take any blood-thinning medications such as: (aspirin, warfarin, Plavix, Aggrenox)  no  If yes we need the name, milligram, dosage and who is prescribing doctor  Current Outpatient Medications on File Prior to Visit  Medication Sig Dispense Refill   MAGNESIUM CITRATE PO Take 400 mg by mouth at bedtime.     Omega-3 Fatty Acids (FISH OIL PO) Take 2,000 mg by mouth daily.     Red Yeast Rice Extract (RED YEAST RICE PO) Take by mouth.     No current facility-administered medications on file prior to visit.    Allergies  Allergen Reactions   Other     NO BLOOD PRODUCTS!!!!!!JEHOVAH WITNESS     Pharmacy: [facility]  Primary Insurance Name: [employer] number where you can be reached: [phone]

## 2022-12-27 NOTE — Telephone Encounter (Signed)
Any room Thanks

## 2023-01-01 MED ORDER — PEG 3350-KCL-NA BICARB-NACL 420 G PO SOLR: 4000.0000 mL | Freq: Once | ORAL | 0 refills | Status: AC

## 2023-01-01 NOTE — Addendum Note (Signed)
Addended by: Vicente Males on: 01/01/2023 11:32 AM   Modules accepted: Orders

## 2023-01-01 NOTE — Telephone Encounter (Signed)
Pt contacted and TCS scheduled for 01/31/23. Instructions will be mailed to patient. Prep sent to pharmacy.   Notification or Prior Authorization is not required for the requested services You are not required to submit a notification/prior authorization based on the information provided. The number above acknowledges your inquiry and our response. Please write this number down and refer to it for future inquiries. If you still wish to submit your request for review, please select the Continue with Submission button below. Decision ID #: [id number]

## 2023-01-28 ENCOUNTER — Encounter (INDEPENDENT_AMBULATORY_CARE_PROVIDER_SITE_OTHER): Payer: Self-pay | Admitting: Gastroenterology

## 2023-01-31 ENCOUNTER — Ambulatory Visit (HOSPITAL_BASED_OUTPATIENT_CLINIC_OR_DEPARTMENT_OTHER): Payer: Medicare Other | Admitting: Anesthesiology

## 2023-01-31 ENCOUNTER — Other Ambulatory Visit: Payer: Self-pay

## 2023-01-31 ENCOUNTER — Encounter (HOSPITAL_COMMUNITY)
Admission: RE | Disposition: A | Discharge status: Home / Self Care | Payer: Self-pay | Source: Home / Self Care | Attending: Gastroenterology

## 2023-01-31 ENCOUNTER — Ambulatory Visit (HOSPITAL_COMMUNITY): Payer: Medicare Other | Admitting: Anesthesiology

## 2023-01-31 ENCOUNTER — Ambulatory Visit (HOSPITAL_COMMUNITY)
Admission: RE | Admit: 2023-01-31 | Discharge: 2023-01-31 | Disposition: A | Discharge status: Home / Self Care | Payer: Medicare Other | Attending: Gastroenterology | Admitting: Gastroenterology

## 2023-01-31 ENCOUNTER — Encounter (INDEPENDENT_AMBULATORY_CARE_PROVIDER_SITE_OTHER): Payer: Self-pay | Admitting: *Deleted

## 2023-01-31 ENCOUNTER — Encounter (HOSPITAL_COMMUNITY): Payer: Self-pay | Admitting: Gastroenterology

## 2023-01-31 DIAGNOSIS — D125 Benign neoplasm of sigmoid colon: Secondary | ICD-10-CM | POA: Diagnosis not present

## 2023-01-31 DIAGNOSIS — Z860100 Personal history of colon polyps, unspecified: Secondary | ICD-10-CM

## 2023-01-31 DIAGNOSIS — K5730 Diverticulosis of large intestine without perforation or abscess without bleeding: Secondary | ICD-10-CM | POA: Diagnosis not present

## 2023-01-31 DIAGNOSIS — D120 Benign neoplasm of cecum: Secondary | ICD-10-CM | POA: Diagnosis not present

## 2023-01-31 DIAGNOSIS — D123 Benign neoplasm of transverse colon: Secondary | ICD-10-CM | POA: Diagnosis not present

## 2023-01-31 DIAGNOSIS — E785 Hyperlipidemia, unspecified: Secondary | ICD-10-CM | POA: Diagnosis not present

## 2023-01-31 DIAGNOSIS — K219 Gastro-esophageal reflux disease without esophagitis: Secondary | ICD-10-CM | POA: Diagnosis not present

## 2023-01-31 DIAGNOSIS — K635 Polyp of colon: Secondary | ICD-10-CM | POA: Diagnosis not present

## 2023-01-31 DIAGNOSIS — Z09 Encounter for follow-up examination after completed treatment for conditions other than malignant neoplasm: Secondary | ICD-10-CM | POA: Diagnosis not present

## 2023-01-31 DIAGNOSIS — Z1211 Encounter for screening for malignant neoplasm of colon: Secondary | ICD-10-CM | POA: Insufficient documentation

## 2023-01-31 HISTORY — PX: POLYPECTOMY: SHX149

## 2023-01-31 HISTORY — PX: COLONOSCOPY WITH PROPOFOL: SHX5780

## 2023-01-31 LAB — HM COLONOSCOPY

## 2023-01-31 SURGERY — COLONOSCOPY WITH PROPOFOL
Anesthesia: General

## 2023-01-31 MED ORDER — LIDOCAINE HCL (PF) 2 % IJ SOLN
INTRAMUSCULAR | Status: AC
  Filled 2023-01-31: qty 5

## 2023-01-31 MED ORDER — LIDOCAINE 2% (20 MG/ML) 5 ML SYRINGE
INTRAMUSCULAR | Status: DC | PRN
  Administered 2023-01-31: 60 mg via INTRAVENOUS
  Administered 2023-01-31: 40 mg via INTRAVENOUS

## 2023-01-31 MED ORDER — PROPOFOL 10 MG/ML IV BOLUS
INTRAVENOUS | Status: DC | PRN
  Administered 2023-01-31: 50 mg via INTRAVENOUS
  Administered 2023-01-31: 20 mg via INTRAVENOUS
  Administered 2023-01-31: 30 mg via INTRAVENOUS
  Administered 2023-01-31 (×3): 50 mg via INTRAVENOUS

## 2023-01-31 MED ORDER — PROPOFOL 500 MG/50ML IV EMUL
INTRAVENOUS | Status: AC
  Filled 2023-01-31: qty 50

## 2023-01-31 MED ORDER — PHENYLEPHRINE 80 MCG/ML (10ML) SYRINGE FOR IV PUSH (FOR BLOOD PRESSURE SUPPORT)
PREFILLED_SYRINGE | INTRAVENOUS | Status: DC | PRN
  Administered 2023-01-31: 80 ug via INTRAVENOUS

## 2023-01-31 MED ORDER — LACTATED RINGERS IV SOLN: INTRAVENOUS | Status: DC

## 2023-01-31 NOTE — Anesthesia Preprocedure Evaluation (Signed)
Anesthesia Evaluation  Patient identified by MRN, date of birth, ID band Patient awake    Reviewed: Allergy & Precautions, H&P , NPO status , Patient's Chart, lab work & pertinent test results, reviewed documented beta blocker date and time   Airway Mallampati: II  TM Distance: >3 FB Neck ROM: full    Dental no notable dental hx.    Pulmonary neg pulmonary ROS   Pulmonary exam normal breath sounds clear to auscultation       Cardiovascular Exercise Tolerance: Good negative cardio ROS  Rhythm:regular Rate:Normal     Neuro/Psych negative neurological ROS  negative psych ROS   GI/Hepatic negative GI ROS, Neg liver ROS,GERD  ,,  Endo/Other  negative endocrine ROS    Renal/GU negative Renal ROS  negative genitourinary   Musculoskeletal   Abdominal   Peds  Hematology negative hematology ROS (+)   Anesthesia Other Findings   Reproductive/Obstetrics negative OB ROS                             Anesthesia Physical Anesthesia Plan  ASA: 2  Anesthesia Plan: General   Post-op Pain Management:    Induction:   PONV Risk Score and Plan: Propofol infusion  Airway Management Planned:   Additional Equipment:   Intra-op Plan:   Post-operative Plan:   Informed Consent: I have reviewed the patients History and Physical, chart, labs and discussed the procedure including the risks, benefits and alternatives for the proposed anesthesia with the patient or authorized representative who has indicated his/her understanding and acceptance.     Dental Advisory Given  Plan Discussed with: CRNA  Anesthesia Plan Comments:        Anesthesia Quick Evaluation  

## 2023-01-31 NOTE — Anesthesia Procedure Notes (Signed)
Procedure Name: MAC Date/Time: 01/31/2023 7:40 AM  Performed by: Adria Dill, CRNAPre-anesthesia Checklist: Patient identified, Emergency Drugs available, Suction available and Patient being monitored Patient Re-evaluated:Patient Re-evaluated prior to induction Oxygen Delivery Method: Nasal cannula Preoxygenation: Pre-oxygenation with 100% oxygen Induction Type: IV induction Placement Confirmation: positive ETCO2 and breath sounds checked- equal and bilateral Dental Injury: Teeth and Oropharynx as per pre-operative assessment

## 2023-01-31 NOTE — Op Note (Signed)
[facility] Patient Name: Sarah Hickman Procedure Date: 01/31/2023 7:09 AM MRN: [id number] Date of Birth: 06/10/1956 Attending MD: Katrinka Blazing , , [id number] CSN: [id number] Age: 67 Admit Type: Outpatient Procedure:                Colonoscopy Indications:              Surveillance: History of piecemeal removal adenoma                            on last colonoscopy (< 3 yrs) Providers:                Katrinka Blazing, Crystal Page, Kristine L. Jessee Avers, Technician Referring MD:              Medicines:                Monitored Anesthesia Care Complications:            No immediate complications. Estimated Blood Loss:     Estimated blood loss: none. Procedure:                Pre-Anesthesia Assessment:                           - Prior to the procedure, a History and Physical                            was performed, and patient medications, allergies                            and sensitivities were reviewed. The patient's                            tolerance of previous anesthesia was reviewed.                           - The risks and benefits of the procedure and the                            sedation options and risks were discussed with the                            patient. All questions were answered and informed                            consent was obtained.                           - ASA Grade Assessment: II - A patient with mild                            systemic disease.                           After obtaining informed consent, the colonoscope  was passed under direct vision. Throughout the                            procedure, the patient's blood pressure, pulse, and                            oxygen saturations were monitored continuously. The                            [id number]-HQ190L ([id number]) scope was introduced through                            the anus and advanced to the the terminal ileum.                             The colonoscopy was performed without difficulty.                            The patient tolerated the procedure well. The                            quality of the bowel preparation was excellent. Scope In: 7:38:43 AM Scope Out: 7:59:33 AM Scope Withdrawal Time: 0 hours 16 minutes 26 seconds  Total Procedure Duration: 0 hours 20 minutes 50 seconds  Findings:      The perianal and digital rectal examinations were normal.      The terminal ileum appeared normal.      Two sessile polyps were found in the transverse colon and cecum. The       polyps were 2 to 4 mm in size. These polyps were removed with a cold       snare. Resection and retrieval were complete.      A 4 mm polyp was found in the sigmoid colon. The polyp was sessile. The       polyp was removed with a cold snare. Resection and retrieval were       complete.      A few small-mouthed diverticula were found in the sigmoid colon.      The retroflexed view of the distal rectum and anal verge was normal and       showed no anal or rectal abnormalities.      Note: no residual polyp found at IC valve Impression:               - The examined portion of the ileum was normal.                           - Two 2 to 4 mm polyps in the transverse colon and                            in the cecum, removed with a cold snare. Resected                            and retrieved.                           -  One 4 mm polyp in the sigmoid colon, removed with                            a cold snare. Resected and retrieved.                           - Diverticulosis in the sigmoid colon.                           - The distal rectum and anal verge are normal on                            retroflexion view. Moderate Sedation:      Per Anesthesia Care Recommendation:           - Discharge patient to home (ambulatory).                           - Resume previous diet.                           - Await pathology results.                            - Repeat colonoscopy in 5 years for surveillance. Procedure Code(s):        --- Professional ---                           [id number], Colonoscopy, flexible; with removal of                            tumor(s), polyp(s), or other lesion(s) by snare                            technique Diagnosis Code(s):        --- Professional ---                           Z86.010, Personal history of colonic polyps                           D12.3, Benign neoplasm of transverse colon (hepatic                            flexure or splenic flexure)                           D12.0, Benign neoplasm of cecum                           D12.5, Benign neoplasm of sigmoid colon                           K57.30, Diverticulosis of large intestine without                            perforation or  abscess without bleeding CPT copyright 2022 American Medical Association. All rights reserved. The codes documented in this report are preliminary and upon coder review may  be revised to meet current compliance requirements. Katrinka Blazing, MD Katrinka Blazing,  01/31/2023 8:06:58 AM This report has been signed electronically. Number of Addenda: 0

## 2023-01-31 NOTE — Discharge Instructions (Signed)
You are being discharged to home.  Resume your previous diet.  We are waiting for your pathology results.  Your physician has recommended a repeat colonoscopy in five years for surveillance.  

## 2023-01-31 NOTE — H&P (Signed)
Sarah Hickman is an 67 y.o. female.   Chief Complaint: history colon polyps HPI: 67 y/o F with PMH HLD and GERD, coming for history colon polyps. Last colonoscopy was on 12/2019, had piecemeal polypectomy of a 10-15 TA in Ic valve. The patient denies having any complaints such as melena, hematochezia, abdominal pain or distention, change in her bowel movement consistency or frequency, no changes in weight recently.  No family history of colorectal cancer.   Past Medical History:  Diagnosis Date   GERD (gastroesophageal reflux disease) 02/25/2017   Hyperlipidemia     Past Surgical History:  Procedure Laterality Date   BIOPSY  01/04/2020   Procedure: BIOPSY;  Surgeon: Malissa Hippo, MD;  Location: [facility];  Service: Endoscopy;;   COLONOSCOPY N/A 01/04/2020   Procedure: COLONOSCOPY;  Surgeon: Malissa Hippo, MD;  Location: [facility];  Service: Endoscopy;  Laterality: N/A;  12:45-office moved to 3/15 @ 7:30am   ESOPHAGOGASTRODUODENOSCOPY N/A 01/04/2020   Procedure: ESOPHAGOGASTRODUODENOSCOPY (EGD);  Surgeon: Malissa Hippo, MD;  Location: [facility];  Service: Endoscopy;  Laterality: N/A;   PARTIAL HYSTERECTOMY     one ovary is left (rt). endometriosis   POLYPECTOMY  01/04/2020   Procedure: POLYPECTOMY;  Surgeon: Malissa Hippo, MD;  Location: [facility];  Service: Endoscopy;;    Family History  Problem Relation Age of Onset   Rheum arthritis Mother    Heart attack Mother    Diabetes Father    Hypertension Brother    Heart attack Paternal Grandfather    Colon cancer Neg Hx    Social History:  reports that she has never smoked. She has never used smokeless tobacco. She reports current alcohol use of about 2.0 standard drinks of alcohol per week. She reports that she does not use drugs.  Allergies:  Allergies  Allergen Reactions   Other     NO BLOOD PRODUCTS!!!!!!JEHOVAH WITNESS    Medications Prior to Admission  Medication Sig Dispense Refill   L-LYSINE PO  Take 1 tablet by mouth daily.     MAGNESIUM CITRATE PO Take 400 mg by mouth at bedtime.     Omega-3 Fatty Acids (FISH OIL PO) Take 2,000 mg by mouth daily.      No results found for this or any previous visit (from the past 48 hour(s)). No results found.  Review of Systems  All other systems reviewed and are negative.   Blood pressure 122/73, pulse 94, temperature 98.4 F (36.9 C), temperature source Oral, resp. rate 19, height 4\' 10"  (1.473 m), weight 62.6 kg, SpO2 98 %. Physical Exam  GENERAL: The patient is AO x3, in no acute distress. HEENT: Head is normocephalic and atraumatic. EOMI are intact. Mouth is well hydrated and without lesions. NECK: Supple. No masses LUNGS: Clear to auscultation. No presence of rhonchi/wheezing/rales. Adequate chest expansion HEART: RRR, normal s1 and s2. ABDOMEN: Soft, nontender, no guarding, no peritoneal signs, and nondistended. BS +. No masses. EXTREMITIES: Without any cyanosis, clubbing, rash, lesions or edema. NEUROLOGIC: AOx3, no focal motor deficit. SKIN: no jaundice, no rashes  Assessment/Plan 67 y/o F with PMH HLD and GERD, coming for history colon polyps. Will proceed with colonoscopy.  Sarah Frame, MD 01/31/2023, 7:32 AM

## 2023-01-31 NOTE — Transfer of Care (Signed)
Immediate Anesthesia Transfer of Care Note  Patient: Sarah Hickman  Procedure(s) Performed: COLONOSCOPY WITH PROPOFOL POLYPECTOMY INTESTINAL  Patient Location: PACU  Anesthesia Type:MAC  Level of Consciousness: awake and patient cooperative  Airway & Oxygen Therapy: Patient Spontanous Breathing  Post-op Assessment: Report given to RN and Post -op Vital signs reviewed and stable  Post vital signs: Reviewed and stable  Last Vitals:  Vitals Value Taken Time  BP    Temp    Pulse    Resp    SpO2      Last Pain:  Vitals:   01/31/23 0735  TempSrc:   PainSc: 0-No pain      Patients Stated Pain Goal: 8 (01/31/23 0086)  Complications: No notable events documented.

## 2023-02-01 LAB — SURGICAL PATHOLOGY

## 2023-02-01 NOTE — Anesthesia Postprocedure Evaluation (Signed)
Anesthesia Post Note  Patient: Sarah Hickman  Procedure(s) Performed: COLONOSCOPY WITH PROPOFOL POLYPECTOMY INTESTINAL  Patient location during evaluation: Phase II Anesthesia Type: General Level of consciousness: awake Pain management: pain level controlled Vital Signs Assessment: post-procedure vital signs reviewed and stable Respiratory status: spontaneous breathing and respiratory function stable Cardiovascular status: blood pressure returned to baseline and stable Postop Assessment: no headache and no apparent nausea or vomiting Anesthetic complications: no Comments: Late entry   No notable events documented.   Last Vitals:  Vitals:   01/31/23 0805 01/31/23 0809  BP: (!) 93/46 (!) 104/59  Pulse: 82   Resp: 15   Temp: 36.4 C   SpO2: 100%     Last Pain:  Vitals:   01/31/23 0805  TempSrc: Oral  PainSc: 0-No pain                 Windell Norfolk

## 2023-02-07 ENCOUNTER — Encounter (HOSPITAL_COMMUNITY): Payer: Self-pay | Admitting: Gastroenterology

## 2023-02-11 IMAGING — US US SOFT TISSUE
1 series · 14 of 16 positions shown · non-contrast
Comparison: None.
COMPARISON: None.

Addendum:
CLINICAL DATA: Palpable mass in the right chest and right shoulder

EXAM:
ULTRASOUND OF CHEST SOFT TISSUES
TECHNIQUE: Ultrasound examination of the chest wall soft tissues was performed
in the area of clinical concern.

[Series 1: us soft tissue · 0.06mm/px · 14 of 19 slices shown]
[im 1/19]
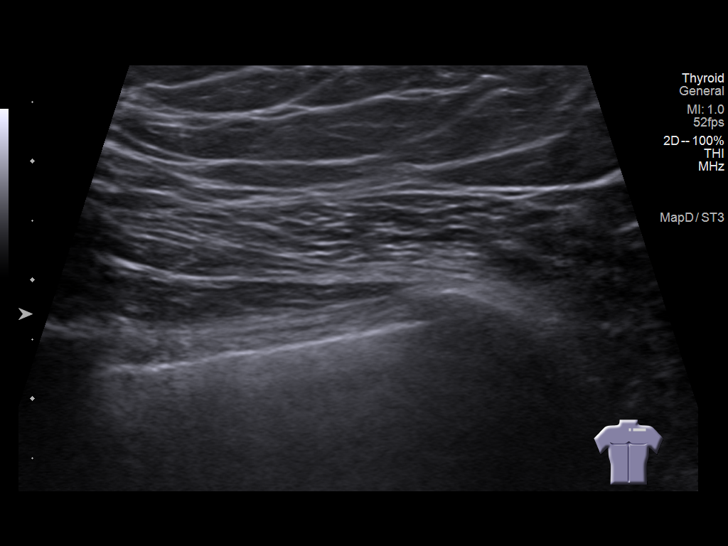
[im 2/19]
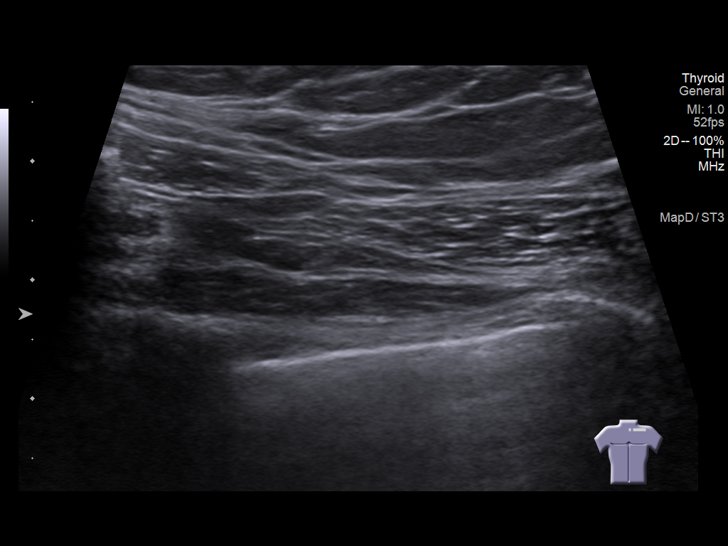
[im 3/19]
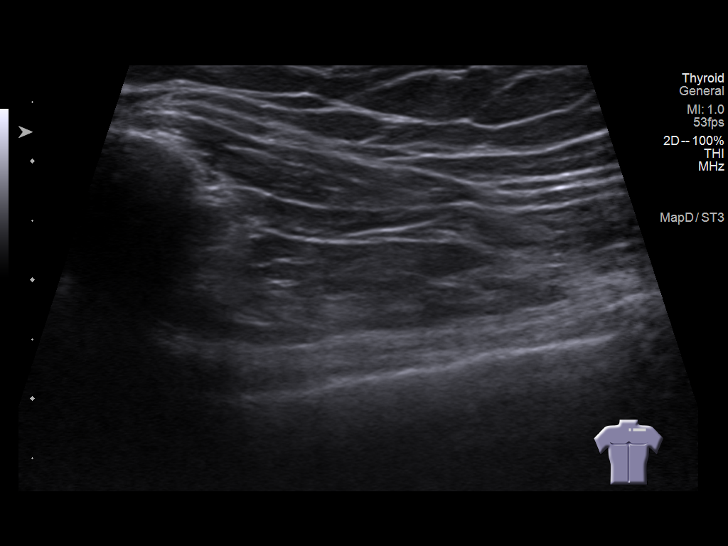
[im 5/19]
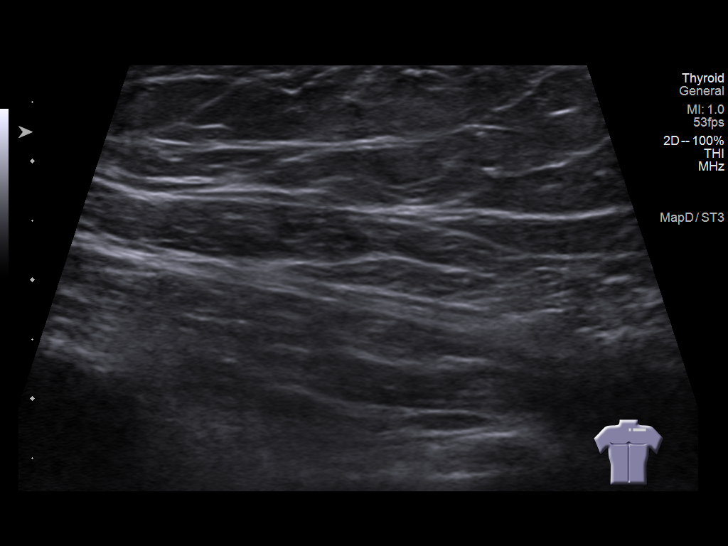
[im 7/19]
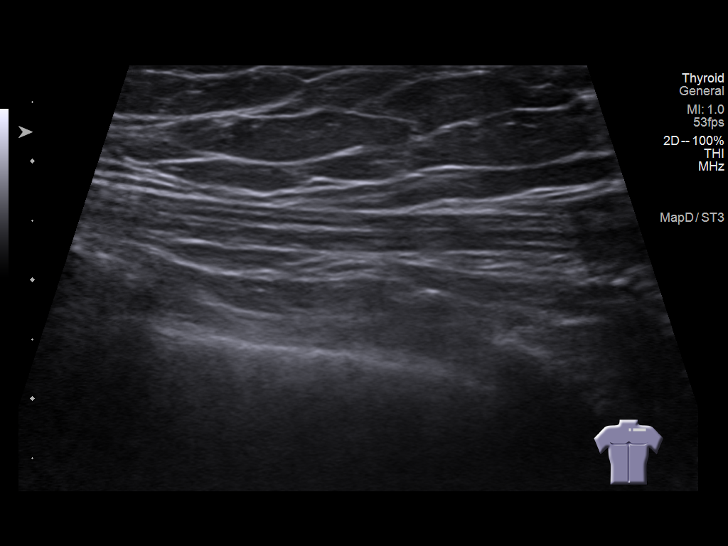
[im 8/19]
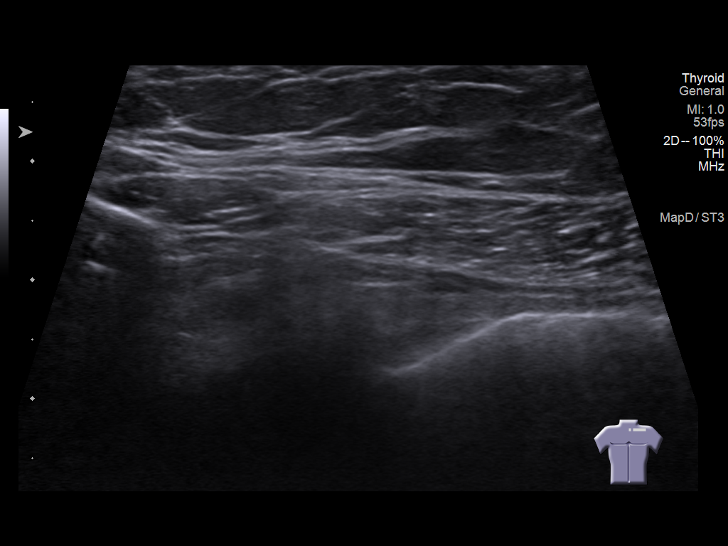
[im 9/19]
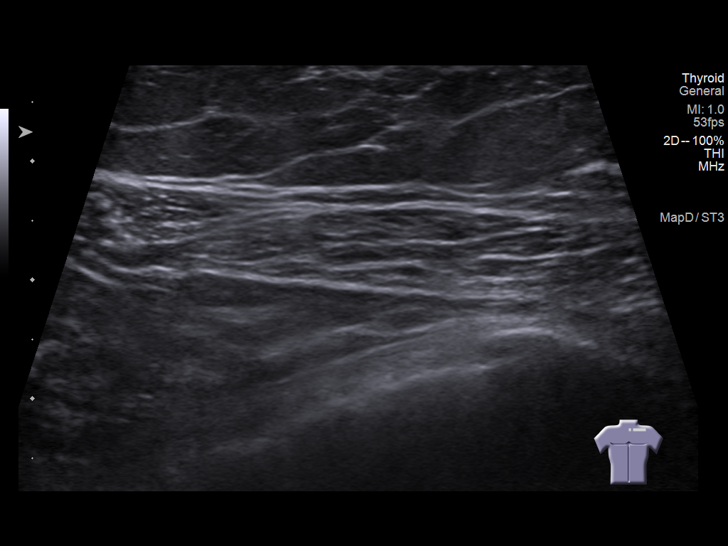
[im 10/19]
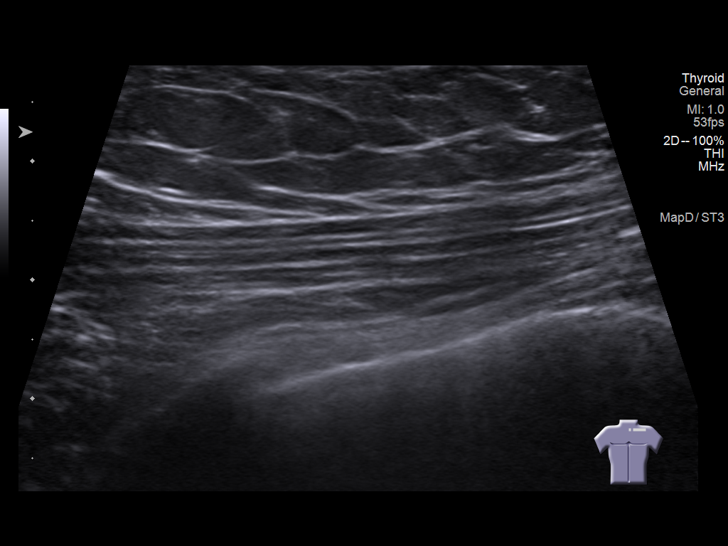
[im 11/19]
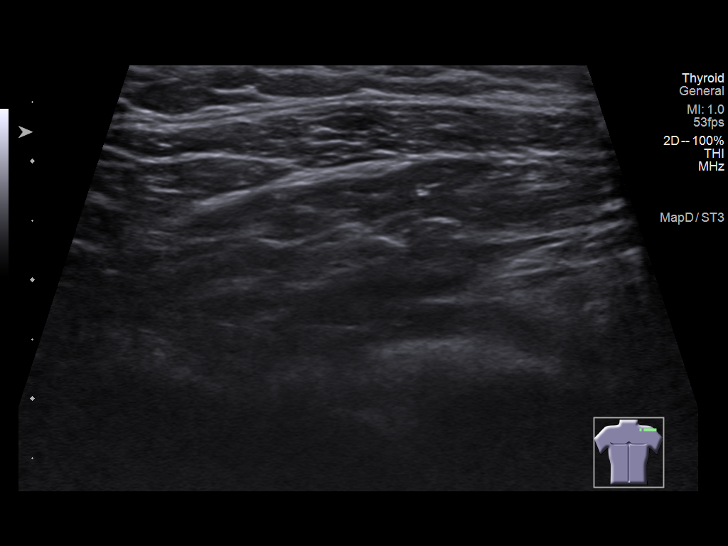
[im 13/19]
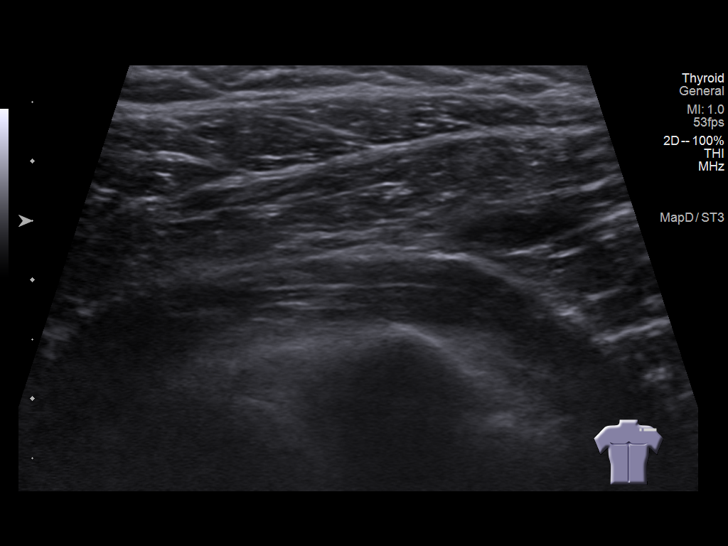
[im 15/19]
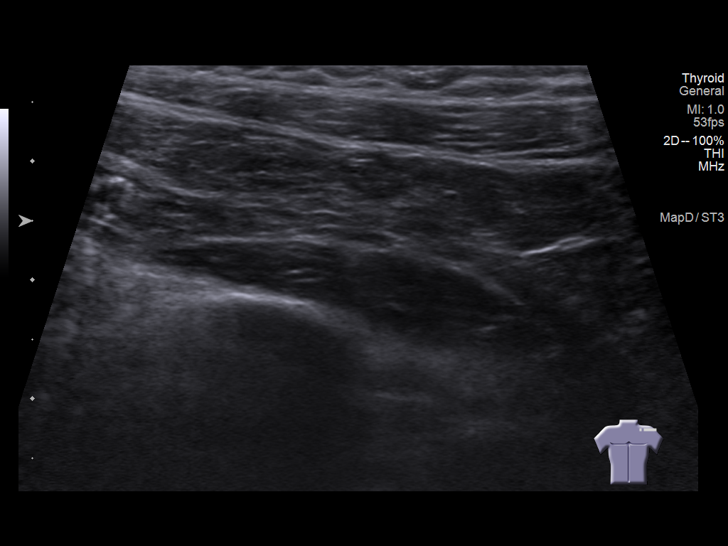
[im 16/19]
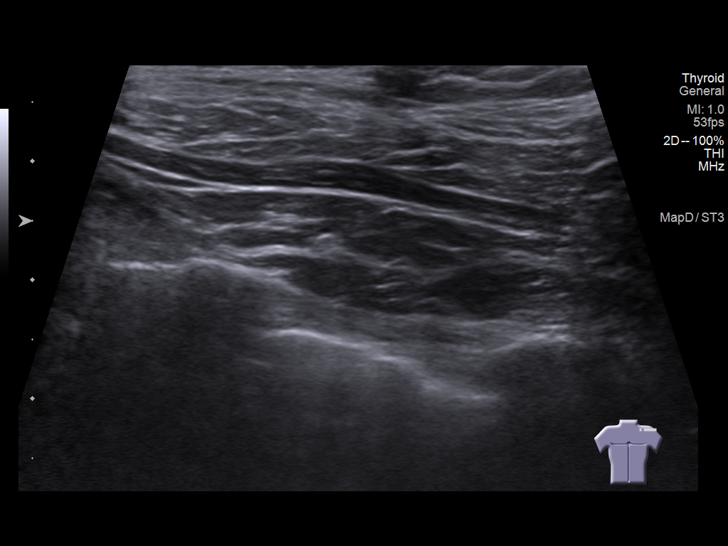
[im 17/19]
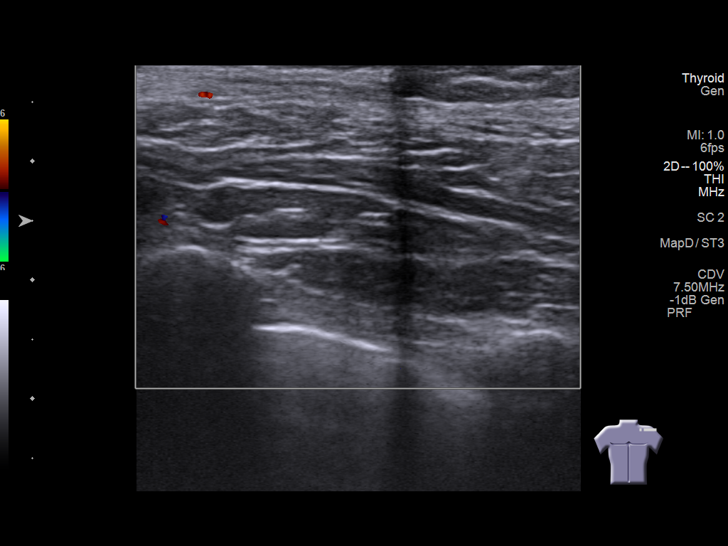
[im 19/19]
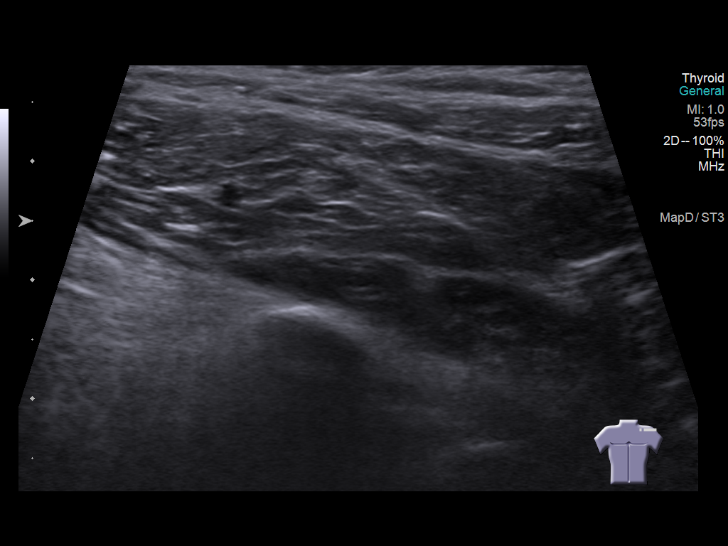

[14 of 16 positions shown; findings below may reference images not displayed]

FINDINGS: There is no sonographic abnormality in the left chest or shoulder
area in the region of concern. The soft tissues appear
sonographically normal.
IMPRESSION: No sonographic abnormality identified in the area of concern. If the
palpable abnormality persists, cross sectional imaging or mammogram
may be considered.

ADDENDUM:
The history should read that there is a palpable area of abnormality
in the LEFT chest/shoulder. The left chest was scanned, not the
right.

*** End of Addendum ***
FINDINGS: There is no sonographic abnormality in the left chest or shoulder
area in the region of concern. The soft tissues appear
sonographically normal.
IMPRESSION: No sonographic abnormality identified in the area of concern. If the
palpable abnormality persists, cross sectional imaging or mammogram
may be considered.

## 2023-02-11 IMAGING — US US EXTREM LOW*L* LIMITED
1 series · 14 of 17 positions shown · non-contrast
Comparison: None.

CLINICAL DATA: Palpable abnormality in the ankles bilaterally for
several weeks, initial encounter

EXAM:
ULTRASOUND LEFT LOWER EXTREMITY LIMITED
TECHNIQUE: Ultrasound examination of the lower extremity soft tissues was
performed in the area of clinical concern.

[Series 1: us extrem low*left* limited · 0.05mm/px · 14 of 17 slices shown]
[im 1/17]
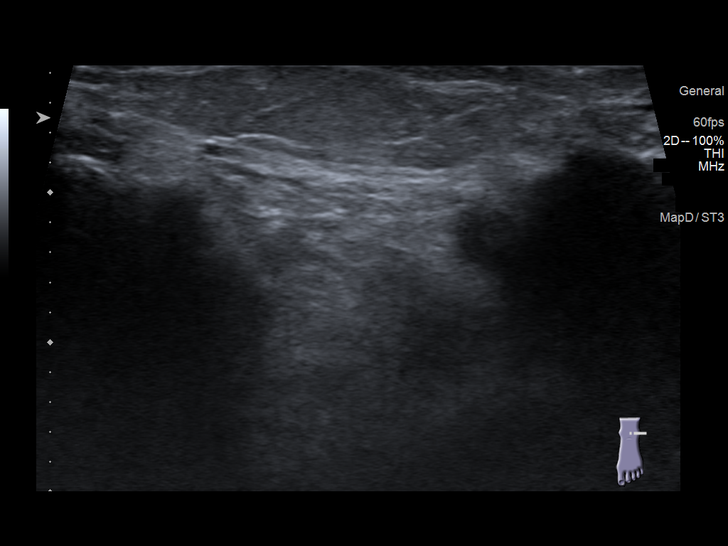
[im 2/17]
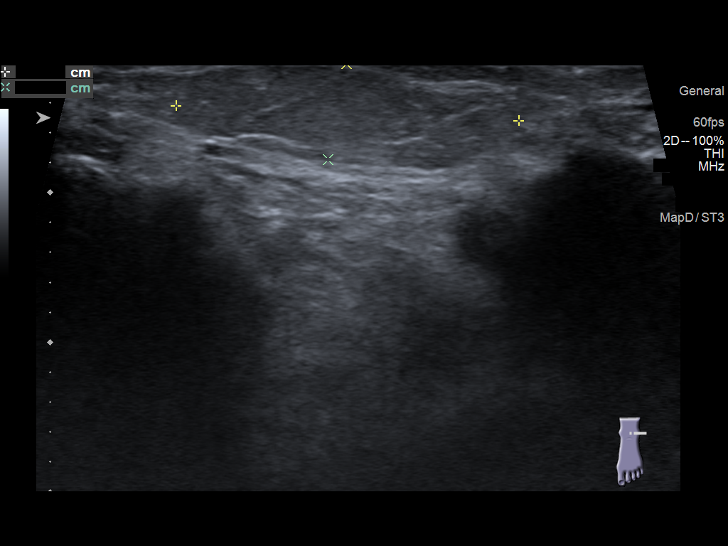
[im 4/17]
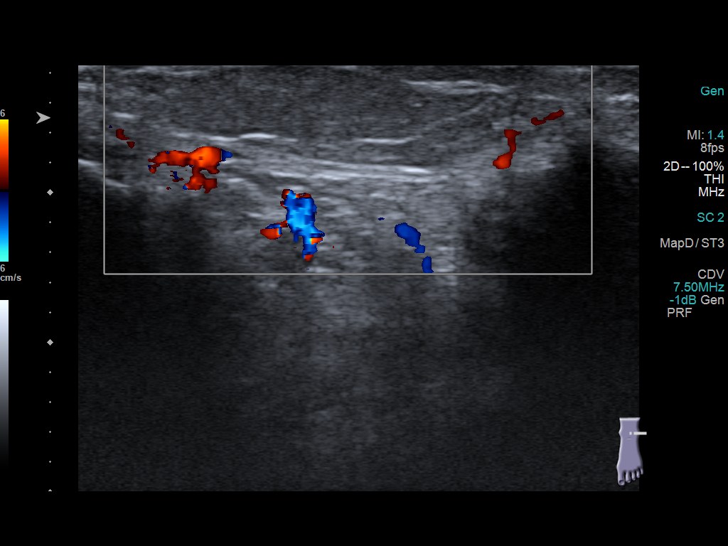
[im 5/17]
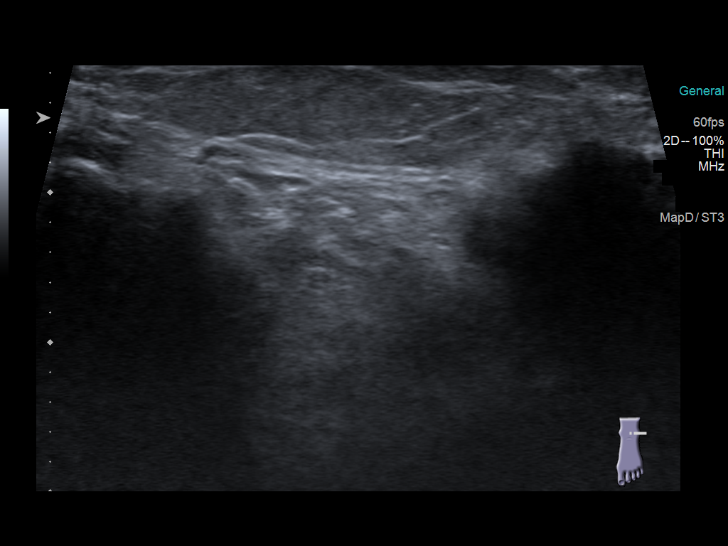
[im 6/17]
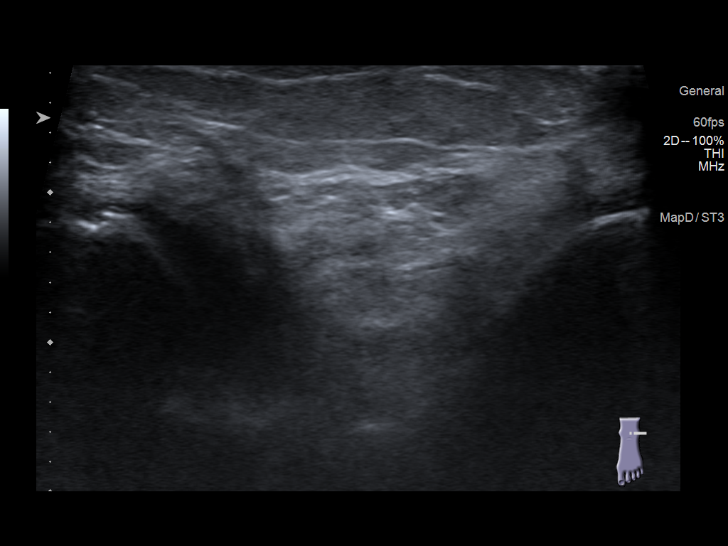
[im 7/17]
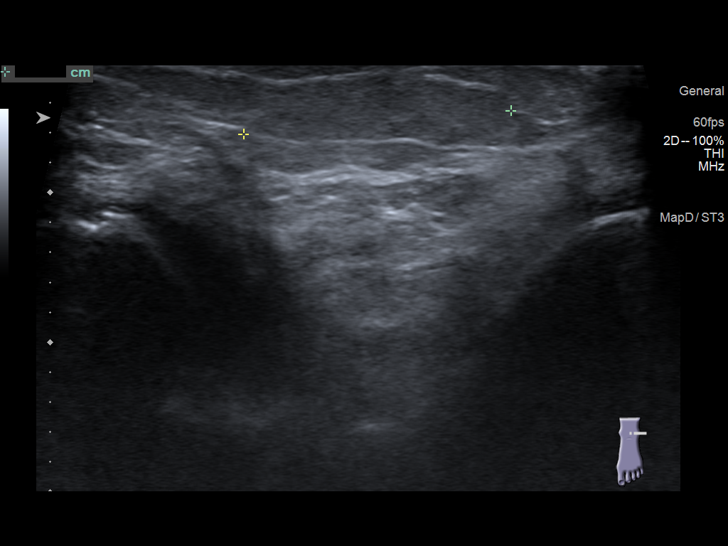
[im 8/17]
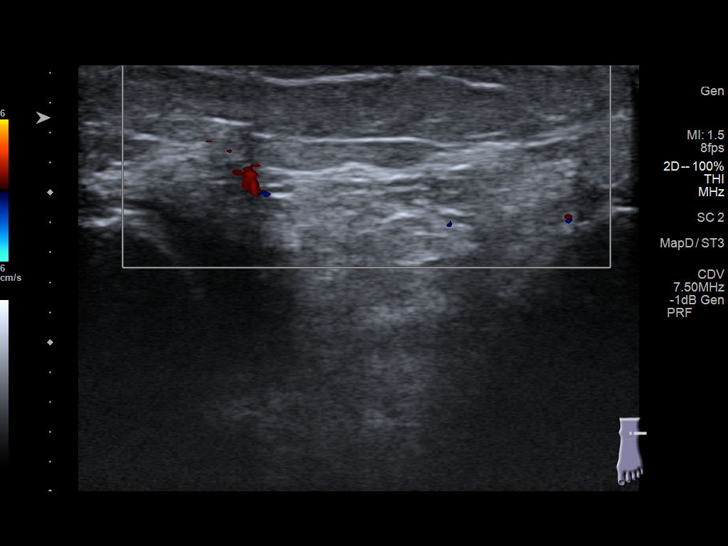
[im 10/17]
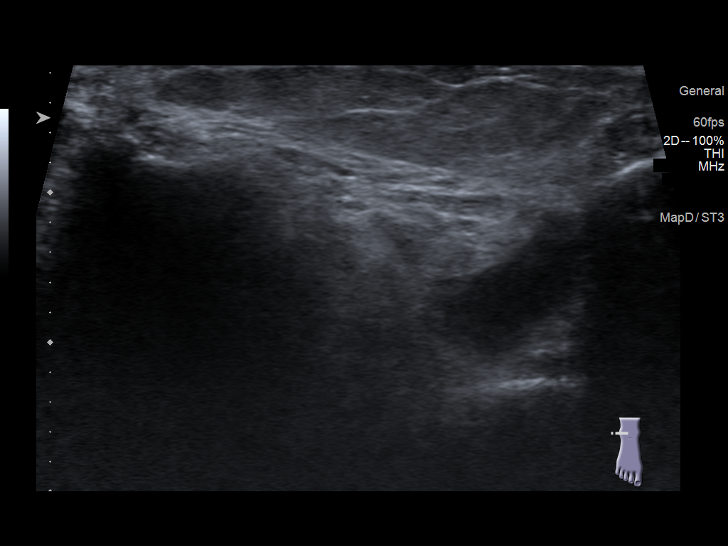
[im 11/17]
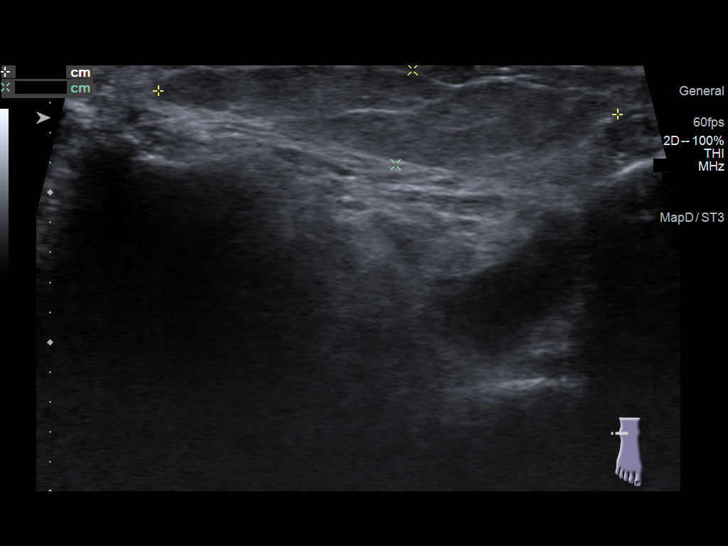
[im 12/17]
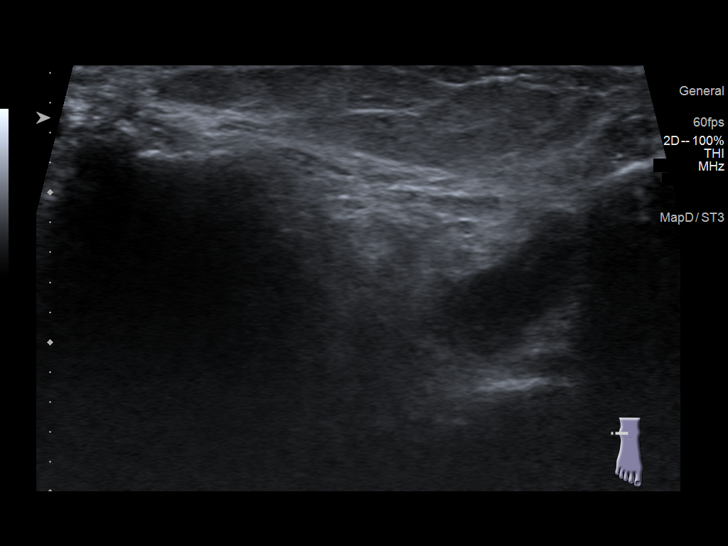
[im 13/17]
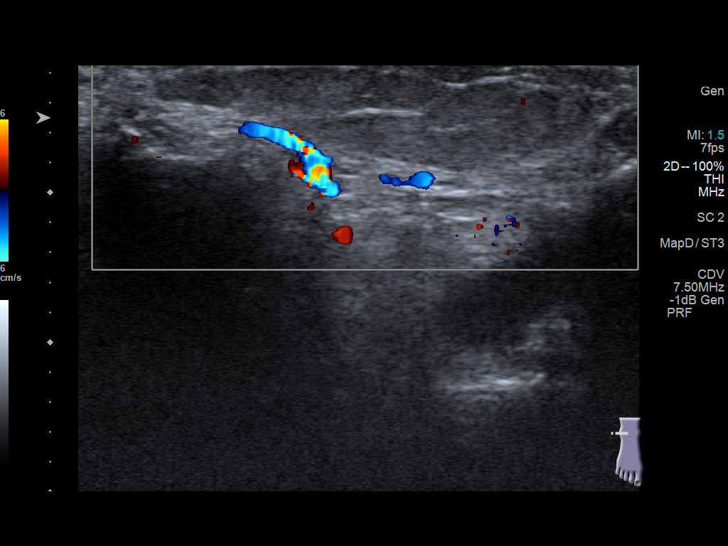
[im 14/17]
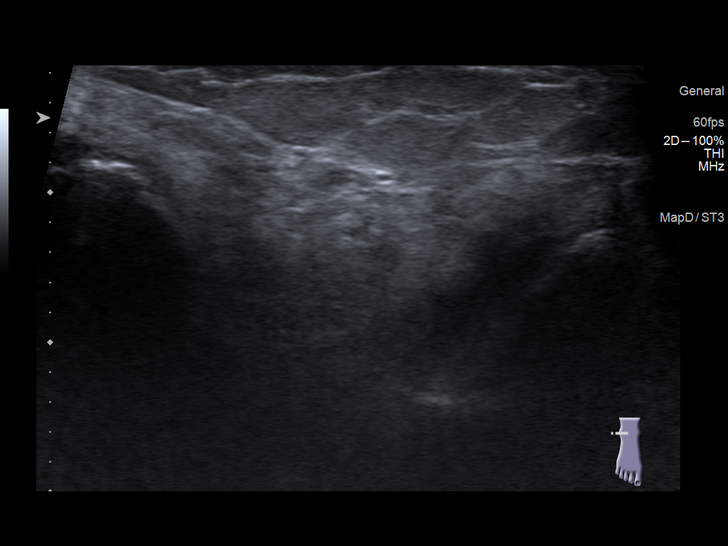
[im 16/17]
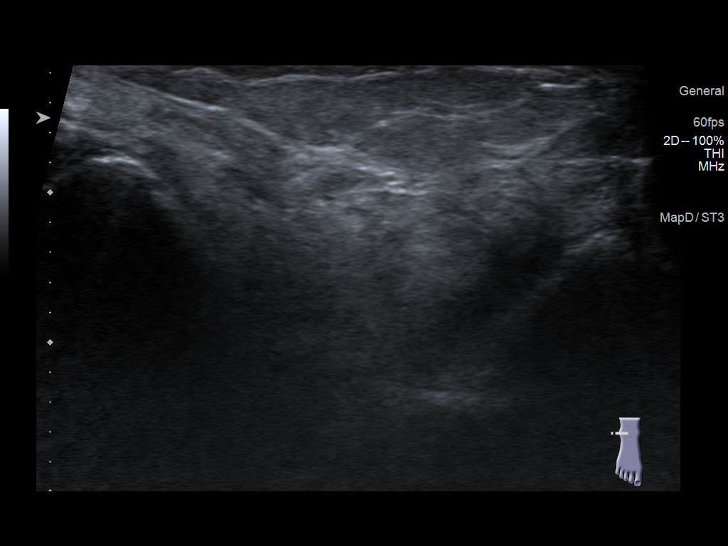
[im 17/17]
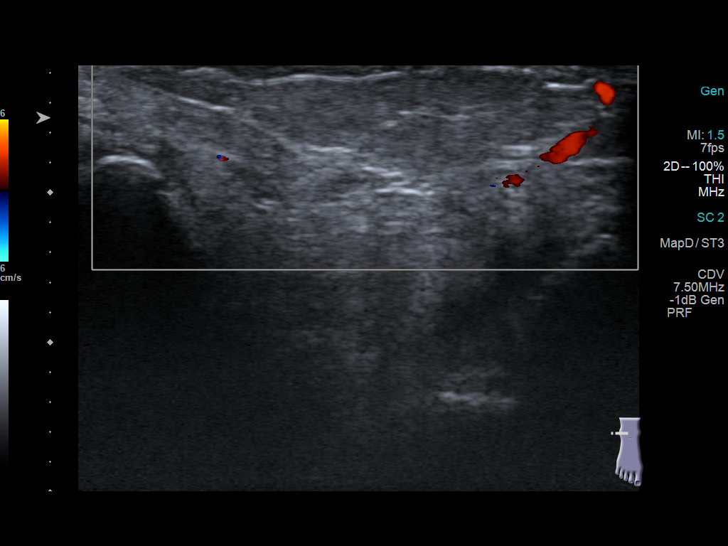

[14 of 17 positions shown; findings below may reference images not displayed]

FINDINGS: Scanning in the area of clinical concern reveals isoechoic lesion in
the lateral aspect of the left ankle measuring 3.1 x 0.6 x 2.3 cm.
This corresponds to the patient's clinical abnormality and likely
represents a focal lipoma. No other focal abnormality is noted.
IMPRESSION: Changes consistent with lateral left ankle lipoma.
